# Patient Record
Sex: Male | Born: 1935 | Race: White | Hispanic: No
Health system: Southern US, Community
[De-identification: ages and names within clinical notes are randomized; demographics above are authoritative.]

---

## 2010-09-24 ENCOUNTER — Other Ambulatory Visit: Payer: Self-pay | Admitting: Urology

## 2010-09-24 ENCOUNTER — Ambulatory Visit (INDEPENDENT_AMBULATORY_CARE_PROVIDER_SITE_OTHER): Payer: Medicare Other | Admitting: Urology

## 2010-09-24 ENCOUNTER — Ambulatory Visit (HOSPITAL_COMMUNITY)
Admission: RE | Admit: 2010-09-24 | Discharge: 2010-09-24 | Disposition: A | Discharge status: Home / Self Care | Payer: Medicare Other | Source: Ambulatory Visit | Attending: Urology | Admitting: Urology

## 2010-09-24 DIAGNOSIS — R339 Retention of urine, unspecified: Secondary | ICD-10-CM

## 2010-09-24 DIAGNOSIS — N210 Calculus in bladder: Secondary | ICD-10-CM

## 2010-09-24 DIAGNOSIS — N420 Calculus of prostate: Secondary | ICD-10-CM

## 2010-09-24 DIAGNOSIS — R3915 Urgency of urination: Secondary | ICD-10-CM

## 2010-09-24 DIAGNOSIS — N401 Enlarged prostate with lower urinary tract symptoms: Secondary | ICD-10-CM

## 2010-09-24 DIAGNOSIS — R109 Unspecified abdominal pain: Secondary | ICD-10-CM | POA: Insufficient documentation

## 2010-11-05 ENCOUNTER — Ambulatory Visit (INDEPENDENT_AMBULATORY_CARE_PROVIDER_SITE_OTHER): Payer: Medicare Other | Admitting: Urology

## 2010-11-05 DIAGNOSIS — R351 Nocturia: Secondary | ICD-10-CM

## 2010-11-05 DIAGNOSIS — N401 Enlarged prostate with lower urinary tract symptoms: Secondary | ICD-10-CM

## 2010-11-05 DIAGNOSIS — N210 Calculus in bladder: Secondary | ICD-10-CM

## 2011-01-21 ENCOUNTER — Ambulatory Visit (INDEPENDENT_AMBULATORY_CARE_PROVIDER_SITE_OTHER): Payer: Medicare Other | Admitting: Urology

## 2011-01-21 DIAGNOSIS — N401 Enlarged prostate with lower urinary tract symptoms: Secondary | ICD-10-CM

## 2011-01-21 DIAGNOSIS — R3915 Urgency of urination: Secondary | ICD-10-CM

## 2011-06-10 ENCOUNTER — Ambulatory Visit (INDEPENDENT_AMBULATORY_CARE_PROVIDER_SITE_OTHER): Payer: Medicare Other | Admitting: Urology

## 2011-06-10 DIAGNOSIS — N210 Calculus in bladder: Secondary | ICD-10-CM

## 2011-06-10 DIAGNOSIS — R351 Nocturia: Secondary | ICD-10-CM

## 2011-06-10 DIAGNOSIS — R3915 Urgency of urination: Secondary | ICD-10-CM

## 2011-06-10 DIAGNOSIS — N401 Enlarged prostate with lower urinary tract symptoms: Secondary | ICD-10-CM

## 2011-07-19 DIAGNOSIS — N400 Enlarged prostate without lower urinary tract symptoms: Secondary | ICD-10-CM | POA: Diagnosis not present

## 2011-07-19 DIAGNOSIS — I10 Essential (primary) hypertension: Secondary | ICD-10-CM | POA: Diagnosis not present

## 2011-07-19 DIAGNOSIS — E782 Mixed hyperlipidemia: Secondary | ICD-10-CM | POA: Diagnosis not present

## 2011-07-29 DIAGNOSIS — E782 Mixed hyperlipidemia: Secondary | ICD-10-CM | POA: Diagnosis not present

## 2011-07-29 DIAGNOSIS — I10 Essential (primary) hypertension: Secondary | ICD-10-CM | POA: Diagnosis not present

## 2011-07-29 DIAGNOSIS — G4700 Insomnia, unspecified: Secondary | ICD-10-CM | POA: Diagnosis not present

## 2011-07-29 DIAGNOSIS — J438 Other emphysema: Secondary | ICD-10-CM | POA: Diagnosis not present

## 2011-07-29 DIAGNOSIS — N529 Male erectile dysfunction, unspecified: Secondary | ICD-10-CM | POA: Diagnosis not present

## 2011-07-29 DIAGNOSIS — M1990 Unspecified osteoarthritis, unspecified site: Secondary | ICD-10-CM | POA: Diagnosis not present

## 2011-07-29 DIAGNOSIS — I77811 Abdominal aortic ectasia: Secondary | ICD-10-CM | POA: Diagnosis not present

## 2011-07-29 DIAGNOSIS — N400 Enlarged prostate without lower urinary tract symptoms: Secondary | ICD-10-CM | POA: Diagnosis not present

## 2011-11-17 DIAGNOSIS — H353190 Nonexudative age-related macular degeneration, unspecified eye, stage unspecified: Secondary | ICD-10-CM | POA: Diagnosis not present

## 2012-01-13 ENCOUNTER — Ambulatory Visit: Payer: Medicare Other | Admitting: Urology

## 2012-02-01 DIAGNOSIS — I739 Peripheral vascular disease, unspecified: Secondary | ICD-10-CM | POA: Diagnosis not present

## 2012-02-01 DIAGNOSIS — M79609 Pain in unspecified limb: Secondary | ICD-10-CM | POA: Diagnosis not present

## 2012-02-01 DIAGNOSIS — B351 Tinea unguium: Secondary | ICD-10-CM | POA: Diagnosis not present

## 2012-02-03 DIAGNOSIS — E782 Mixed hyperlipidemia: Secondary | ICD-10-CM | POA: Diagnosis not present

## 2012-02-10 DIAGNOSIS — I4891 Unspecified atrial fibrillation: Secondary | ICD-10-CM | POA: Diagnosis not present

## 2012-02-10 DIAGNOSIS — N400 Enlarged prostate without lower urinary tract symptoms: Secondary | ICD-10-CM | POA: Diagnosis not present

## 2012-02-10 DIAGNOSIS — G4700 Insomnia, unspecified: Secondary | ICD-10-CM | POA: Diagnosis not present

## 2012-02-10 DIAGNOSIS — I77811 Abdominal aortic ectasia: Secondary | ICD-10-CM | POA: Diagnosis not present

## 2012-02-10 DIAGNOSIS — E782 Mixed hyperlipidemia: Secondary | ICD-10-CM | POA: Diagnosis not present

## 2012-02-10 DIAGNOSIS — M1990 Unspecified osteoarthritis, unspecified site: Secondary | ICD-10-CM | POA: Diagnosis not present

## 2012-02-10 DIAGNOSIS — J209 Acute bronchitis, unspecified: Secondary | ICD-10-CM | POA: Diagnosis not present

## 2012-02-10 DIAGNOSIS — I10 Essential (primary) hypertension: Secondary | ICD-10-CM | POA: Diagnosis not present

## 2012-02-14 DIAGNOSIS — N400 Enlarged prostate without lower urinary tract symptoms: Secondary | ICD-10-CM | POA: Diagnosis not present

## 2012-02-14 DIAGNOSIS — E782 Mixed hyperlipidemia: Secondary | ICD-10-CM | POA: Diagnosis not present

## 2012-02-14 DIAGNOSIS — I77811 Abdominal aortic ectasia: Secondary | ICD-10-CM | POA: Diagnosis not present

## 2012-02-14 DIAGNOSIS — N529 Male erectile dysfunction, unspecified: Secondary | ICD-10-CM | POA: Diagnosis not present

## 2012-02-14 DIAGNOSIS — I4891 Unspecified atrial fibrillation: Secondary | ICD-10-CM | POA: Diagnosis not present

## 2012-02-14 DIAGNOSIS — G4700 Insomnia, unspecified: Secondary | ICD-10-CM | POA: Diagnosis not present

## 2012-02-14 DIAGNOSIS — I10 Essential (primary) hypertension: Secondary | ICD-10-CM | POA: Diagnosis not present

## 2012-02-14 DIAGNOSIS — M1990 Unspecified osteoarthritis, unspecified site: Secondary | ICD-10-CM | POA: Diagnosis not present

## 2012-02-21 DIAGNOSIS — E782 Mixed hyperlipidemia: Secondary | ICD-10-CM | POA: Diagnosis not present

## 2012-02-21 DIAGNOSIS — M1990 Unspecified osteoarthritis, unspecified site: Secondary | ICD-10-CM | POA: Diagnosis not present

## 2012-02-21 DIAGNOSIS — G4700 Insomnia, unspecified: Secondary | ICD-10-CM | POA: Diagnosis not present

## 2012-02-21 DIAGNOSIS — I4891 Unspecified atrial fibrillation: Secondary | ICD-10-CM | POA: Diagnosis not present

## 2012-02-21 DIAGNOSIS — I10 Essential (primary) hypertension: Secondary | ICD-10-CM | POA: Diagnosis not present

## 2012-02-21 DIAGNOSIS — N400 Enlarged prostate without lower urinary tract symptoms: Secondary | ICD-10-CM | POA: Diagnosis not present

## 2012-02-21 DIAGNOSIS — N529 Male erectile dysfunction, unspecified: Secondary | ICD-10-CM | POA: Diagnosis not present

## 2012-02-21 DIAGNOSIS — J438 Other emphysema: Secondary | ICD-10-CM | POA: Diagnosis not present

## 2012-03-22 DIAGNOSIS — Z23 Encounter for immunization: Secondary | ICD-10-CM | POA: Diagnosis not present

## 2012-04-11 DIAGNOSIS — M79609 Pain in unspecified limb: Secondary | ICD-10-CM | POA: Diagnosis not present

## 2012-04-11 DIAGNOSIS — B351 Tinea unguium: Secondary | ICD-10-CM | POA: Diagnosis not present

## 2012-05-15 DIAGNOSIS — H353190 Nonexudative age-related macular degeneration, unspecified eye, stage unspecified: Secondary | ICD-10-CM | POA: Diagnosis not present

## 2012-05-18 ENCOUNTER — Ambulatory Visit (INDEPENDENT_AMBULATORY_CARE_PROVIDER_SITE_OTHER): Payer: Medicare Other | Admitting: Urology

## 2012-05-18 DIAGNOSIS — R351 Nocturia: Secondary | ICD-10-CM

## 2012-05-18 DIAGNOSIS — R3915 Urgency of urination: Secondary | ICD-10-CM

## 2012-05-18 DIAGNOSIS — N401 Enlarged prostate with lower urinary tract symptoms: Secondary | ICD-10-CM

## 2012-06-22 DIAGNOSIS — B351 Tinea unguium: Secondary | ICD-10-CM | POA: Diagnosis not present

## 2012-06-22 DIAGNOSIS — M79609 Pain in unspecified limb: Secondary | ICD-10-CM | POA: Diagnosis not present

## 2012-08-13 DIAGNOSIS — H353190 Nonexudative age-related macular degeneration, unspecified eye, stage unspecified: Secondary | ICD-10-CM | POA: Diagnosis not present

## 2012-08-24 ENCOUNTER — Ambulatory Visit: Payer: Medicare Other | Admitting: Urology

## 2012-10-19 ENCOUNTER — Ambulatory Visit (INDEPENDENT_AMBULATORY_CARE_PROVIDER_SITE_OTHER): Payer: Medicare Other | Admitting: Urology

## 2012-10-19 DIAGNOSIS — N401 Enlarged prostate with lower urinary tract symptoms: Secondary | ICD-10-CM

## 2012-10-19 DIAGNOSIS — I10 Essential (primary) hypertension: Secondary | ICD-10-CM | POA: Diagnosis not present

## 2012-10-19 DIAGNOSIS — E782 Mixed hyperlipidemia: Secondary | ICD-10-CM | POA: Diagnosis not present

## 2012-10-19 DIAGNOSIS — R5381 Other malaise: Secondary | ICD-10-CM | POA: Diagnosis not present

## 2012-10-19 DIAGNOSIS — N138 Other obstructive and reflux uropathy: Secondary | ICD-10-CM

## 2012-10-19 DIAGNOSIS — N210 Calculus in bladder: Secondary | ICD-10-CM

## 2012-10-22 DIAGNOSIS — N529 Male erectile dysfunction, unspecified: Secondary | ICD-10-CM | POA: Diagnosis not present

## 2012-10-22 DIAGNOSIS — N400 Enlarged prostate without lower urinary tract symptoms: Secondary | ICD-10-CM | POA: Diagnosis not present

## 2012-10-22 DIAGNOSIS — E782 Mixed hyperlipidemia: Secondary | ICD-10-CM | POA: Diagnosis not present

## 2012-10-22 DIAGNOSIS — J438 Other emphysema: Secondary | ICD-10-CM | POA: Diagnosis not present

## 2012-10-22 DIAGNOSIS — I4891 Unspecified atrial fibrillation: Secondary | ICD-10-CM | POA: Diagnosis not present

## 2012-10-22 DIAGNOSIS — I10 Essential (primary) hypertension: Secondary | ICD-10-CM | POA: Diagnosis not present

## 2012-10-22 DIAGNOSIS — J019 Acute sinusitis, unspecified: Secondary | ICD-10-CM | POA: Diagnosis not present

## 2012-10-22 DIAGNOSIS — M1990 Unspecified osteoarthritis, unspecified site: Secondary | ICD-10-CM | POA: Diagnosis not present

## 2013-01-04 DIAGNOSIS — I10 Essential (primary) hypertension: Secondary | ICD-10-CM | POA: Diagnosis not present

## 2013-01-04 DIAGNOSIS — E782 Mixed hyperlipidemia: Secondary | ICD-10-CM | POA: Diagnosis not present

## 2013-01-07 DIAGNOSIS — N529 Male erectile dysfunction, unspecified: Secondary | ICD-10-CM | POA: Diagnosis not present

## 2013-01-07 DIAGNOSIS — E782 Mixed hyperlipidemia: Secondary | ICD-10-CM | POA: Diagnosis not present

## 2013-01-07 DIAGNOSIS — I4891 Unspecified atrial fibrillation: Secondary | ICD-10-CM | POA: Diagnosis not present

## 2013-01-07 DIAGNOSIS — I77811 Abdominal aortic ectasia: Secondary | ICD-10-CM | POA: Diagnosis not present

## 2013-01-07 DIAGNOSIS — I10 Essential (primary) hypertension: Secondary | ICD-10-CM | POA: Diagnosis not present

## 2013-01-07 DIAGNOSIS — M1990 Unspecified osteoarthritis, unspecified site: Secondary | ICD-10-CM | POA: Diagnosis not present

## 2013-01-07 DIAGNOSIS — J438 Other emphysema: Secondary | ICD-10-CM | POA: Diagnosis not present

## 2013-01-07 DIAGNOSIS — N400 Enlarged prostate without lower urinary tract symptoms: Secondary | ICD-10-CM | POA: Diagnosis not present

## 2013-01-14 DIAGNOSIS — H353190 Nonexudative age-related macular degeneration, unspecified eye, stage unspecified: Secondary | ICD-10-CM | POA: Diagnosis not present

## 2013-02-15 ENCOUNTER — Ambulatory Visit (INDEPENDENT_AMBULATORY_CARE_PROVIDER_SITE_OTHER): Payer: Medicare Other | Admitting: Urology

## 2013-02-15 DIAGNOSIS — N401 Enlarged prostate with lower urinary tract symptoms: Secondary | ICD-10-CM

## 2013-02-15 DIAGNOSIS — N210 Calculus in bladder: Secondary | ICD-10-CM | POA: Diagnosis not present

## 2013-02-15 DIAGNOSIS — R82998 Other abnormal findings in urine: Secondary | ICD-10-CM | POA: Diagnosis not present

## 2013-03-18 DIAGNOSIS — B351 Tinea unguium: Secondary | ICD-10-CM | POA: Diagnosis not present

## 2013-03-18 DIAGNOSIS — L851 Acquired keratosis [keratoderma] palmaris et plantaris: Secondary | ICD-10-CM | POA: Diagnosis not present

## 2013-03-18 DIAGNOSIS — I70209 Unspecified atherosclerosis of native arteries of extremities, unspecified extremity: Secondary | ICD-10-CM | POA: Diagnosis not present

## 2013-03-26 DIAGNOSIS — Z23 Encounter for immunization: Secondary | ICD-10-CM | POA: Diagnosis not present

## 2013-04-30 DIAGNOSIS — E782 Mixed hyperlipidemia: Secondary | ICD-10-CM | POA: Diagnosis not present

## 2013-05-06 DIAGNOSIS — M1990 Unspecified osteoarthritis, unspecified site: Secondary | ICD-10-CM | POA: Diagnosis not present

## 2013-05-06 DIAGNOSIS — N529 Male erectile dysfunction, unspecified: Secondary | ICD-10-CM | POA: Diagnosis not present

## 2013-05-06 DIAGNOSIS — N400 Enlarged prostate without lower urinary tract symptoms: Secondary | ICD-10-CM | POA: Diagnosis not present

## 2013-05-06 DIAGNOSIS — J438 Other emphysema: Secondary | ICD-10-CM | POA: Diagnosis not present

## 2013-05-06 DIAGNOSIS — E782 Mixed hyperlipidemia: Secondary | ICD-10-CM | POA: Diagnosis not present

## 2013-05-06 DIAGNOSIS — I77811 Abdominal aortic ectasia: Secondary | ICD-10-CM | POA: Diagnosis not present

## 2013-05-06 DIAGNOSIS — I4891 Unspecified atrial fibrillation: Secondary | ICD-10-CM | POA: Diagnosis not present

## 2013-05-06 DIAGNOSIS — I10 Essential (primary) hypertension: Secondary | ICD-10-CM | POA: Diagnosis not present

## 2013-05-07 ENCOUNTER — Other Ambulatory Visit (HOSPITAL_COMMUNITY): Payer: Self-pay | Admitting: Family Medicine

## 2013-05-07 ENCOUNTER — Other Ambulatory Visit: Payer: Self-pay | Admitting: Urology

## 2013-05-07 DIAGNOSIS — I714 Abdominal aortic aneurysm, without rupture: Secondary | ICD-10-CM

## 2013-05-07 DIAGNOSIS — N210 Calculus in bladder: Secondary | ICD-10-CM

## 2013-05-10 ENCOUNTER — Other Ambulatory Visit: Payer: Self-pay | Admitting: Urology

## 2013-05-10 DIAGNOSIS — N401 Enlarged prostate with lower urinary tract symptoms: Secondary | ICD-10-CM

## 2013-05-16 DIAGNOSIS — H353190 Nonexudative age-related macular degeneration, unspecified eye, stage unspecified: Secondary | ICD-10-CM | POA: Diagnosis not present

## 2013-05-16 DIAGNOSIS — H4011X0 Primary open-angle glaucoma, stage unspecified: Secondary | ICD-10-CM | POA: Diagnosis not present

## 2013-06-17 DIAGNOSIS — B351 Tinea unguium: Secondary | ICD-10-CM | POA: Diagnosis not present

## 2013-06-17 DIAGNOSIS — I70209 Unspecified atherosclerosis of native arteries of extremities, unspecified extremity: Secondary | ICD-10-CM | POA: Diagnosis not present

## 2013-06-17 DIAGNOSIS — L851 Acquired keratosis [keratoderma] palmaris et plantaris: Secondary | ICD-10-CM | POA: Diagnosis not present

## 2013-07-22 ENCOUNTER — Other Ambulatory Visit: Payer: Self-pay | Admitting: Urology

## 2013-07-22 ENCOUNTER — Ambulatory Visit (HOSPITAL_COMMUNITY)
Admission: RE | Admit: 2013-07-22 | Discharge: 2013-07-22 | Disposition: A | Discharge status: Home / Self Care | Payer: Medicare Other | Source: Ambulatory Visit | Attending: Family Medicine | Admitting: Family Medicine

## 2013-07-22 ENCOUNTER — Ambulatory Visit (HOSPITAL_COMMUNITY)
Admission: RE | Admit: 2013-07-22 | Discharge: 2013-07-22 | Disposition: A | Discharge status: Home / Self Care | Payer: Medicare Other | Source: Ambulatory Visit | Attending: Urology | Admitting: Urology

## 2013-07-22 DIAGNOSIS — R9389 Abnormal findings on diagnostic imaging of other specified body structures: Secondary | ICD-10-CM | POA: Diagnosis not present

## 2013-07-22 DIAGNOSIS — I714 Abdominal aortic aneurysm, without rupture, unspecified: Secondary | ICD-10-CM

## 2013-07-22 DIAGNOSIS — N139 Obstructive and reflux uropathy, unspecified: Secondary | ICD-10-CM | POA: Insufficient documentation

## 2013-07-22 DIAGNOSIS — N400 Enlarged prostate without lower urinary tract symptoms: Secondary | ICD-10-CM | POA: Insufficient documentation

## 2013-07-22 DIAGNOSIS — N401 Enlarged prostate with lower urinary tract symptoms: Secondary | ICD-10-CM

## 2013-07-22 DIAGNOSIS — N138 Other obstructive and reflux uropathy: Secondary | ICD-10-CM

## 2013-07-22 DIAGNOSIS — Z135 Encounter for screening for eye and ear disorders: Secondary | ICD-10-CM | POA: Diagnosis not present

## 2013-08-26 DIAGNOSIS — L851 Acquired keratosis [keratoderma] palmaris et plantaris: Secondary | ICD-10-CM | POA: Diagnosis not present

## 2013-08-26 DIAGNOSIS — I70209 Unspecified atherosclerosis of native arteries of extremities, unspecified extremity: Secondary | ICD-10-CM | POA: Diagnosis not present

## 2013-08-26 DIAGNOSIS — B351 Tinea unguium: Secondary | ICD-10-CM | POA: Diagnosis not present

## 2013-10-04 ENCOUNTER — Ambulatory Visit (INDEPENDENT_AMBULATORY_CARE_PROVIDER_SITE_OTHER): Payer: Medicare Other | Admitting: Urology

## 2013-10-04 DIAGNOSIS — N401 Enlarged prostate with lower urinary tract symptoms: Secondary | ICD-10-CM | POA: Diagnosis not present

## 2013-10-04 DIAGNOSIS — N453 Epididymo-orchitis: Secondary | ICD-10-CM | POA: Diagnosis not present

## 2013-10-04 DIAGNOSIS — R82998 Other abnormal findings in urine: Secondary | ICD-10-CM | POA: Diagnosis not present

## 2013-10-04 DIAGNOSIS — N210 Calculus in bladder: Secondary | ICD-10-CM

## 2013-10-04 DIAGNOSIS — N138 Other obstructive and reflux uropathy: Secondary | ICD-10-CM | POA: Diagnosis not present

## 2013-11-08 DIAGNOSIS — I4891 Unspecified atrial fibrillation: Secondary | ICD-10-CM | POA: Diagnosis not present

## 2013-11-08 DIAGNOSIS — I10 Essential (primary) hypertension: Secondary | ICD-10-CM | POA: Diagnosis not present

## 2013-11-08 DIAGNOSIS — E782 Mixed hyperlipidemia: Secondary | ICD-10-CM | POA: Diagnosis not present

## 2013-11-08 DIAGNOSIS — E559 Vitamin D deficiency, unspecified: Secondary | ICD-10-CM | POA: Diagnosis not present

## 2013-11-11 DIAGNOSIS — Z0000 Encounter for general adult medical examination without abnormal findings: Secondary | ICD-10-CM | POA: Diagnosis not present

## 2013-11-11 DIAGNOSIS — I4891 Unspecified atrial fibrillation: Secondary | ICD-10-CM | POA: Diagnosis not present

## 2013-11-11 DIAGNOSIS — I10 Essential (primary) hypertension: Secondary | ICD-10-CM | POA: Diagnosis not present

## 2013-11-11 DIAGNOSIS — J438 Other emphysema: Secondary | ICD-10-CM | POA: Diagnosis not present

## 2013-11-11 DIAGNOSIS — E782 Mixed hyperlipidemia: Secondary | ICD-10-CM | POA: Diagnosis not present

## 2013-11-11 DIAGNOSIS — N400 Enlarged prostate without lower urinary tract symptoms: Secondary | ICD-10-CM | POA: Diagnosis not present

## 2013-11-11 DIAGNOSIS — N529 Male erectile dysfunction, unspecified: Secondary | ICD-10-CM | POA: Diagnosis not present

## 2013-11-11 DIAGNOSIS — M1990 Unspecified osteoarthritis, unspecified site: Secondary | ICD-10-CM | POA: Diagnosis not present

## 2013-12-13 ENCOUNTER — Ambulatory Visit (INDEPENDENT_AMBULATORY_CARE_PROVIDER_SITE_OTHER): Payer: Medicare Other | Admitting: Urology

## 2013-12-13 ENCOUNTER — Other Ambulatory Visit: Payer: Self-pay | Admitting: Urology

## 2013-12-13 DIAGNOSIS — N3020 Other chronic cystitis without hematuria: Secondary | ICD-10-CM | POA: Diagnosis not present

## 2013-12-13 DIAGNOSIS — N453 Epididymo-orchitis: Secondary | ICD-10-CM

## 2013-12-13 DIAGNOSIS — N401 Enlarged prostate with lower urinary tract symptoms: Secondary | ICD-10-CM | POA: Diagnosis not present

## 2013-12-13 DIAGNOSIS — R82998 Other abnormal findings in urine: Secondary | ICD-10-CM | POA: Diagnosis not present

## 2013-12-13 DIAGNOSIS — N210 Calculus in bladder: Secondary | ICD-10-CM | POA: Diagnosis not present

## 2014-02-28 ENCOUNTER — Ambulatory Visit (HOSPITAL_COMMUNITY): Admission: RE | Admit: 2014-02-28 | Payer: Medicare Other | Source: Ambulatory Visit

## 2014-05-08 DIAGNOSIS — Z23 Encounter for immunization: Secondary | ICD-10-CM | POA: Diagnosis not present

## 2014-05-30 DIAGNOSIS — I10 Essential (primary) hypertension: Secondary | ICD-10-CM | POA: Diagnosis not present

## 2014-05-30 DIAGNOSIS — E782 Mixed hyperlipidemia: Secondary | ICD-10-CM | POA: Diagnosis not present

## 2014-05-30 DIAGNOSIS — I482 Chronic atrial fibrillation: Secondary | ICD-10-CM | POA: Diagnosis not present

## 2014-06-02 DIAGNOSIS — N400 Enlarged prostate without lower urinary tract symptoms: Secondary | ICD-10-CM | POA: Diagnosis not present

## 2014-06-02 DIAGNOSIS — M25462 Effusion, left knee: Secondary | ICD-10-CM | POA: Diagnosis not present

## 2014-06-02 DIAGNOSIS — J439 Emphysema, unspecified: Secondary | ICD-10-CM | POA: Diagnosis not present

## 2014-06-02 DIAGNOSIS — I714 Abdominal aortic aneurysm, without rupture: Secondary | ICD-10-CM | POA: Diagnosis not present

## 2014-06-02 DIAGNOSIS — I482 Chronic atrial fibrillation: Secondary | ICD-10-CM | POA: Diagnosis not present

## 2014-06-02 DIAGNOSIS — E782 Mixed hyperlipidemia: Secondary | ICD-10-CM | POA: Diagnosis not present

## 2014-06-02 DIAGNOSIS — Z0001 Encounter for general adult medical examination with abnormal findings: Secondary | ICD-10-CM | POA: Diagnosis not present

## 2014-06-02 DIAGNOSIS — I10 Essential (primary) hypertension: Secondary | ICD-10-CM | POA: Diagnosis not present

## 2014-06-02 DIAGNOSIS — Z1389 Encounter for screening for other disorder: Secondary | ICD-10-CM | POA: Diagnosis not present

## 2014-09-09 DIAGNOSIS — R300 Dysuria: Secondary | ICD-10-CM | POA: Diagnosis not present

## 2014-11-29 IMAGING — US US PELVIS LIMITED
1 series · 9 of 9 positions shown · non-contrast
Comparison: None

Correlation:  CT pelvis 06/29/2010, abdominal radiograph 09/24/2010

CLINICAL DATA: Prostate hypertrophy, urinary tract obstruction

EXAM:
US PELVIS LIMITED
TECHNIQUE: Ultrasound examination of the pelvic soft tissues was performed in
the area of clinical concern including the bladder and visualized
prostate gland.

[Series 1: us pelvis limited · 0.21mm/px · 9 of 9 slices shown]
[im 1/9]
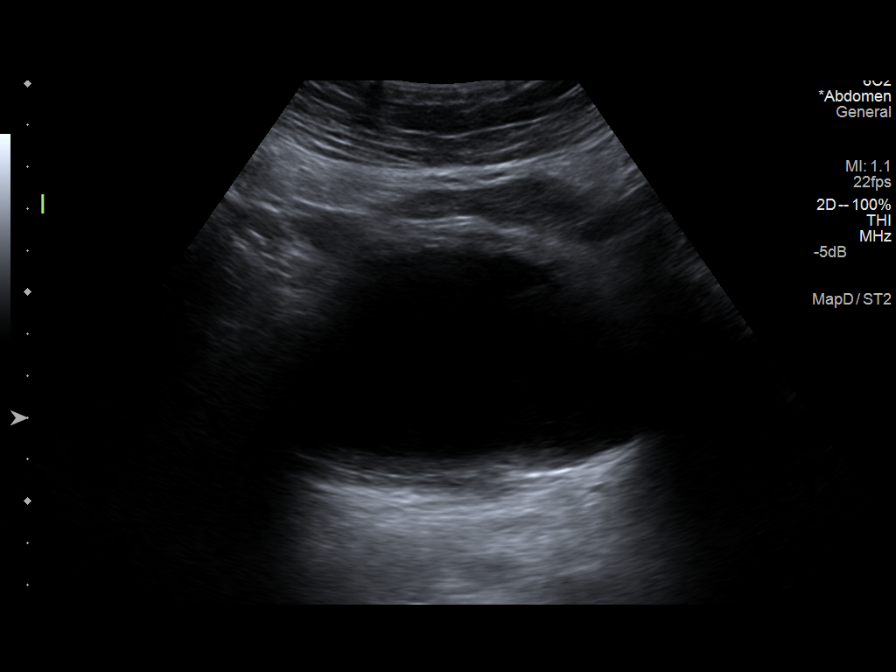
[im 2/9]
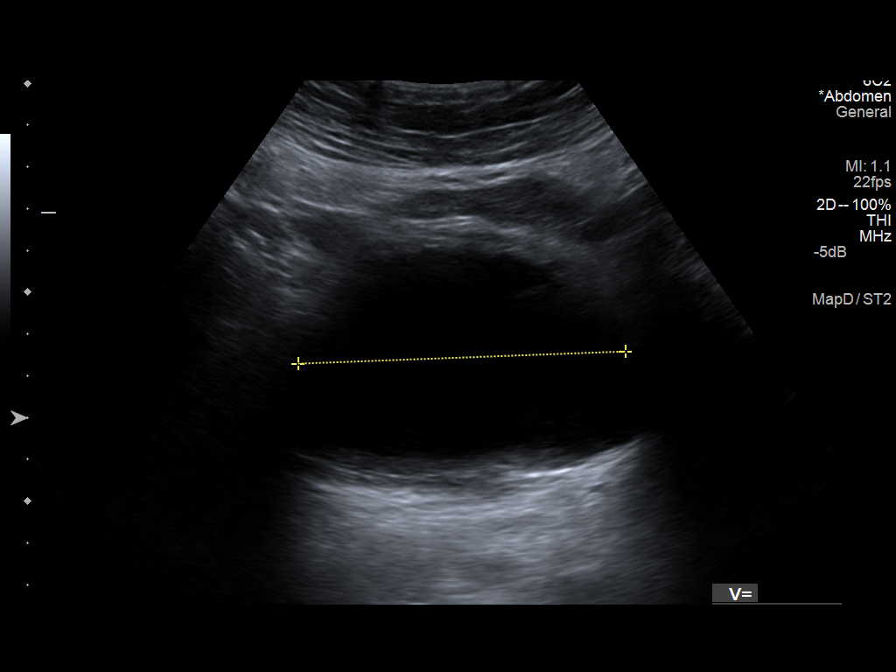
[im 3/9]
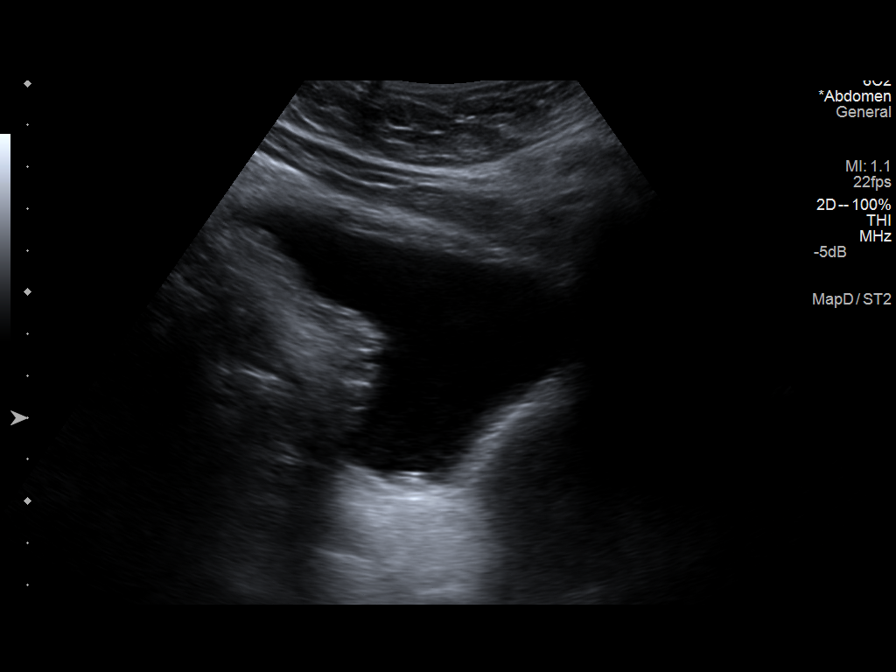
[im 4/9]
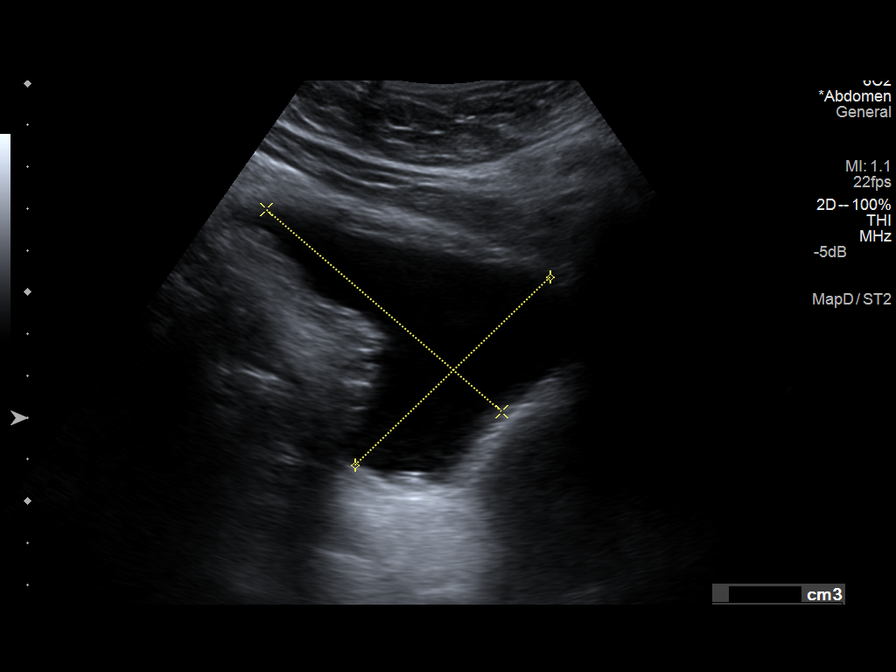
[im 5/9]
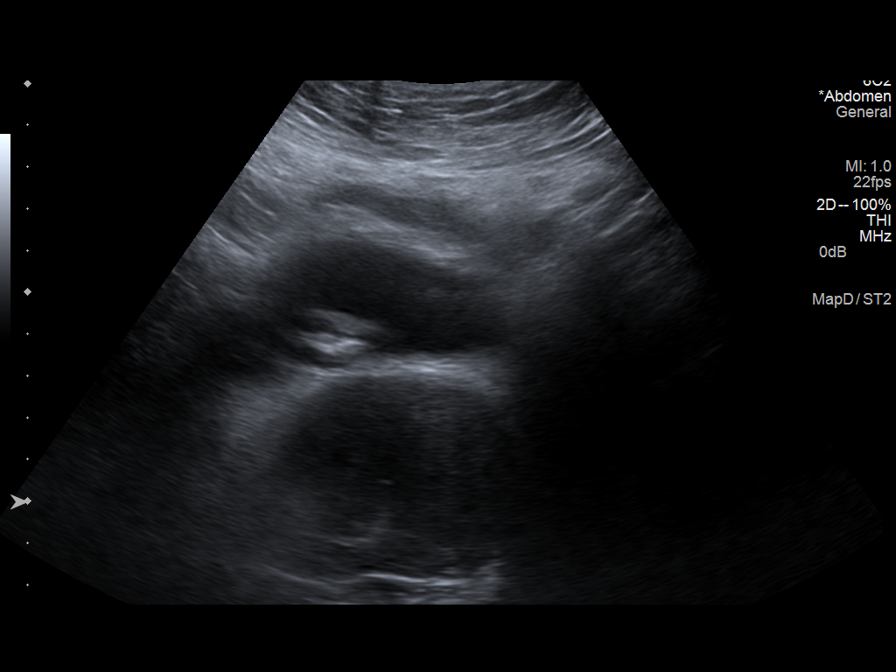
[im 6/9]
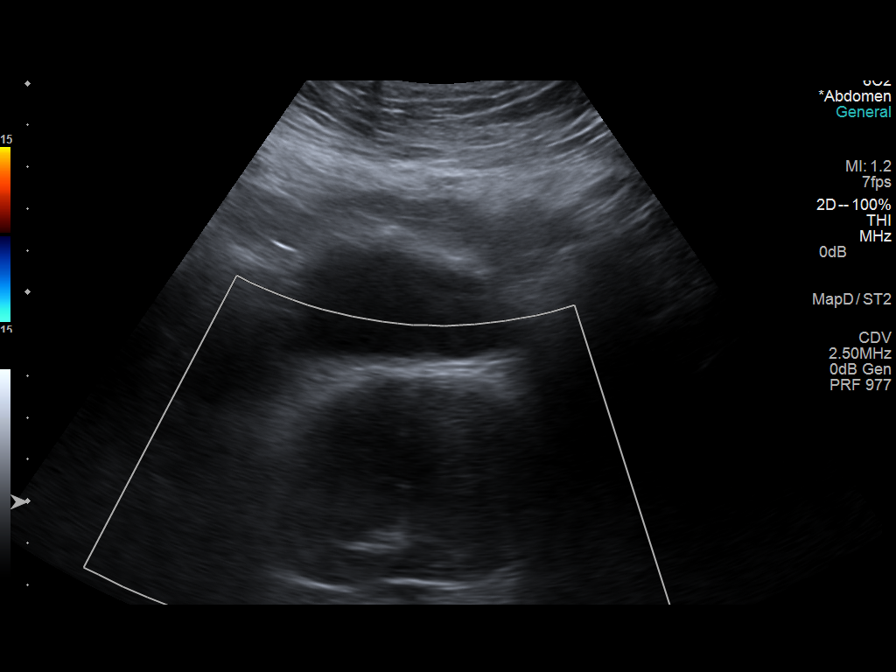
[im 7/9]
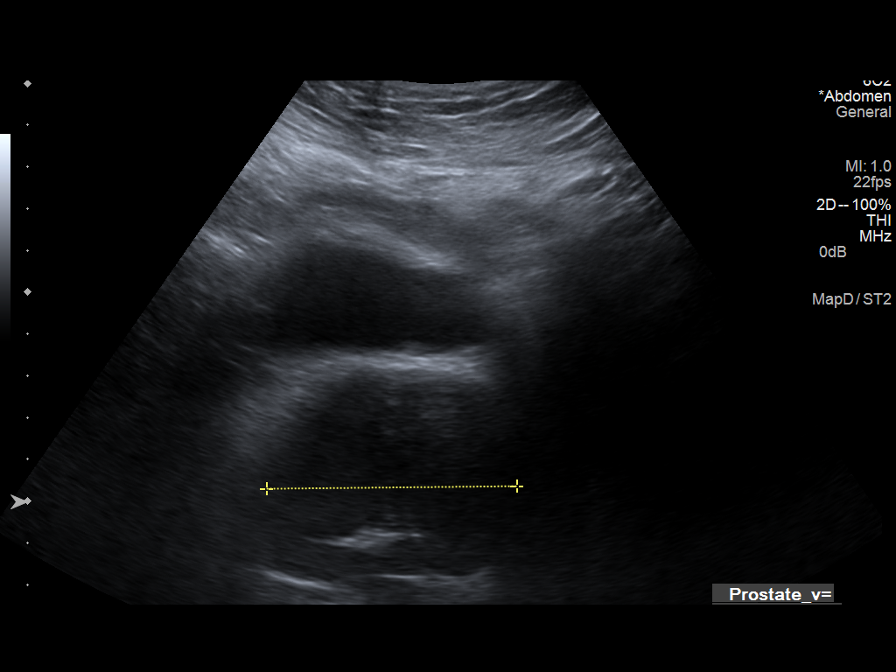
[im 8/9]
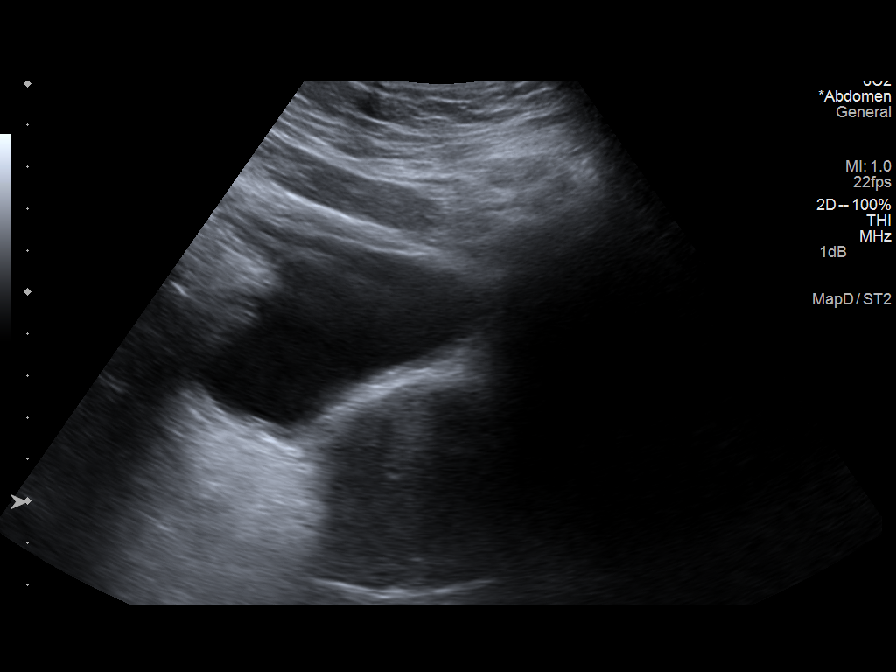
[im 9/9]
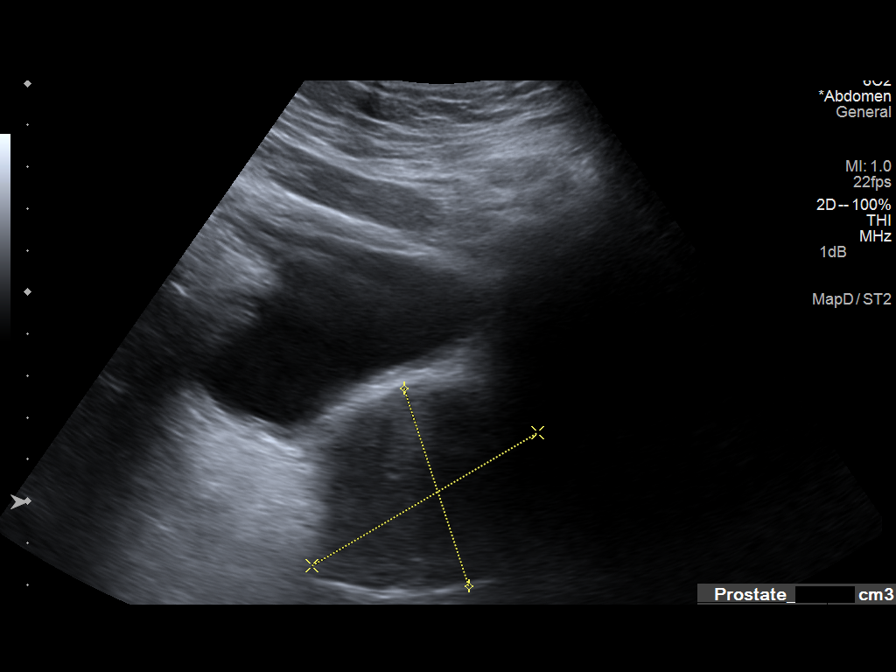

[9 of 9 positions shown; findings below may reference images not displayed]

FINDINGS: Bladder distends normally.

No definite bladder wall thickening or focal bladder mass
identified.

Bilateral ureteral jets noted.

Intraluminal echogenic focus with shadowing identified within
bladder 17 x 7 x 21 cm likely a large bladder calculus.

Patient had a smaller bladder calculus 14 mm in diameter on prior
abdominal radiograph.

Prevoid bladder volume calculated at 220 mL with a postvoid residual
of 198 mL, poor emptying demonstrated.

Prostate gland appears enlarged, 6.0 x 6.3 x 5.0 cm, calculated
volume 98 cm3.
IMPRESSION: Probable large bladder calculus 21 x 17 x 7 mm.

Significant postvoid urinary bladder residual volume.

Significant prostatic enlargement.

## 2014-11-29 IMAGING — US US PELVIS LIMITED
2 series · 14 of 19 positions shown · non-contrast
Comparison: None

Correlation:  CT pelvis 06/29/2010, abdominal radiograph 09/24/2010

CLINICAL DATA: Prostate hypertrophy, urinary tract obstruction

EXAM:
US PELVIS LIMITED
TECHNIQUE: Ultrasound examination of the pelvic soft tissues was performed in
the area of clinical concern including the bladder and visualized
prostate gland.

[Series 1: us pelvis limited · 0.18mm/px · 13 of 17 slices shown (1 of 2)]
[im 1/17]
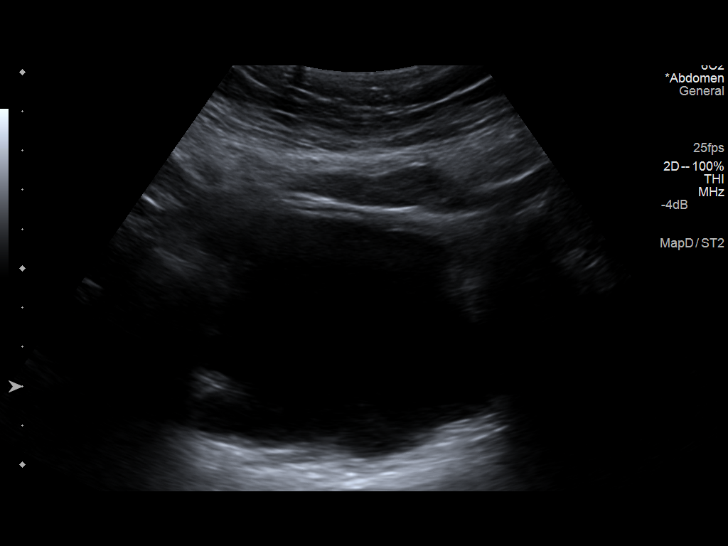
[im 3/17]
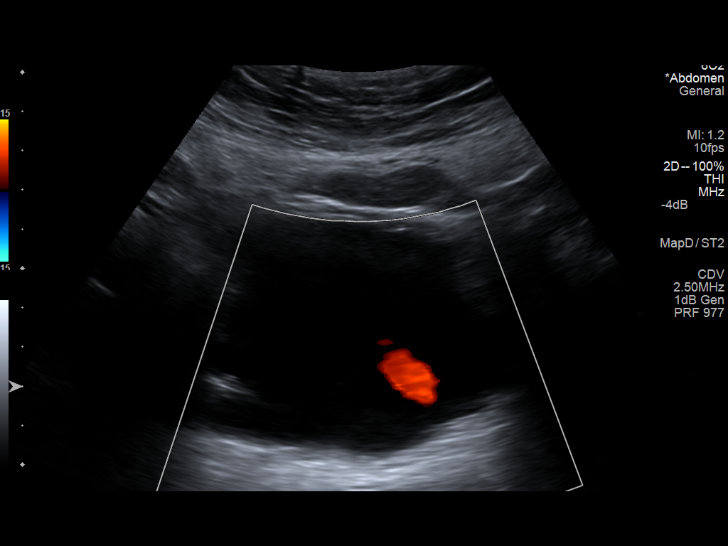
[im 4/17]
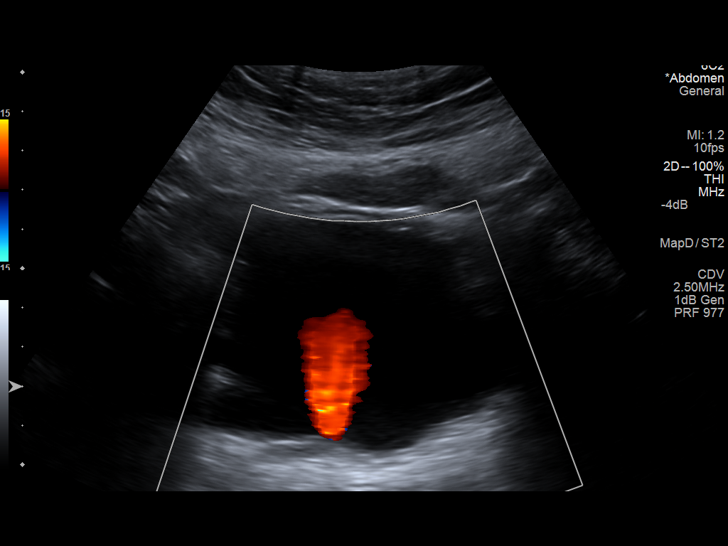
[im 5/17]
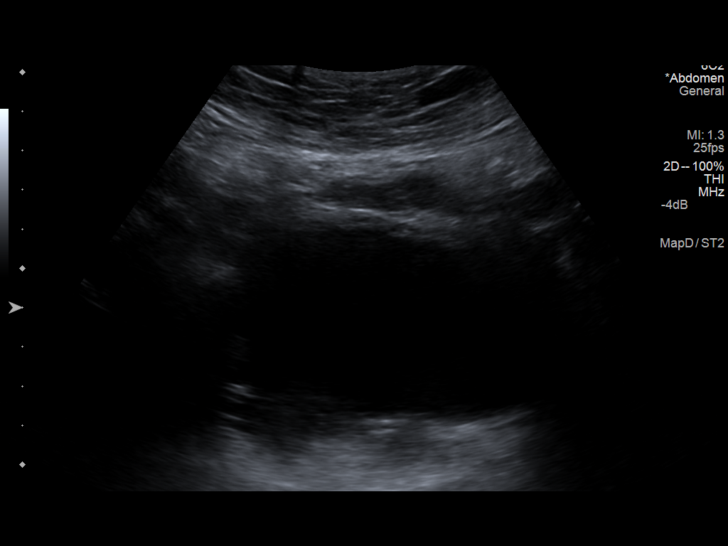
[im 7/17]
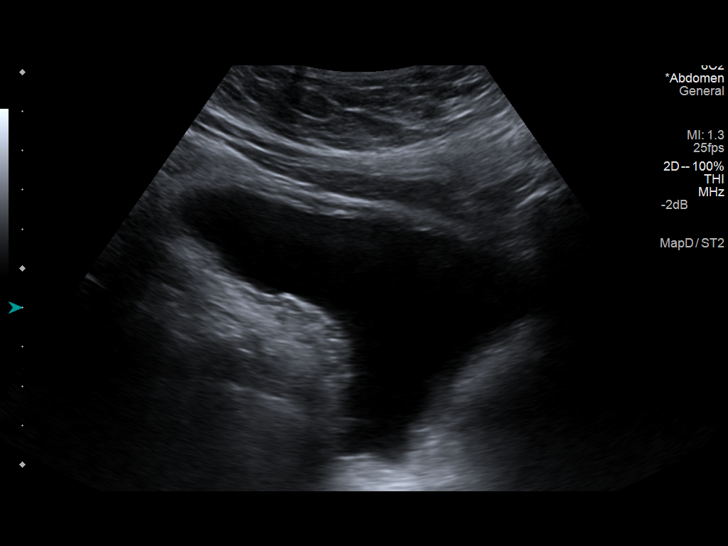
[im 8/17]
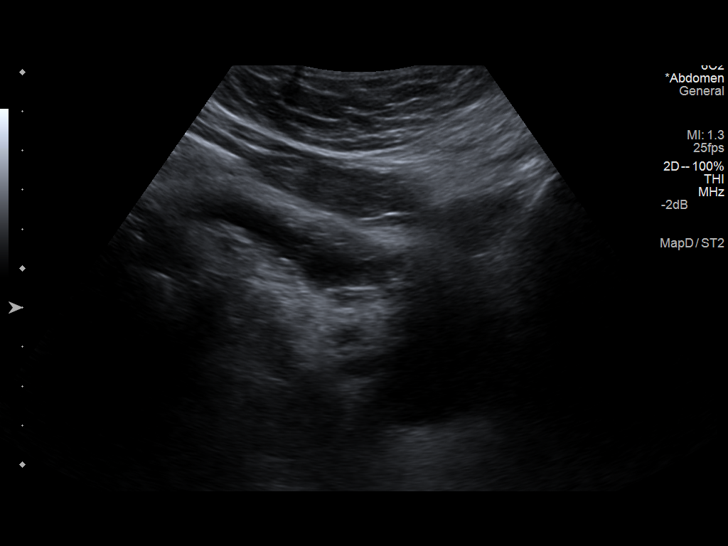
[im 9/17]
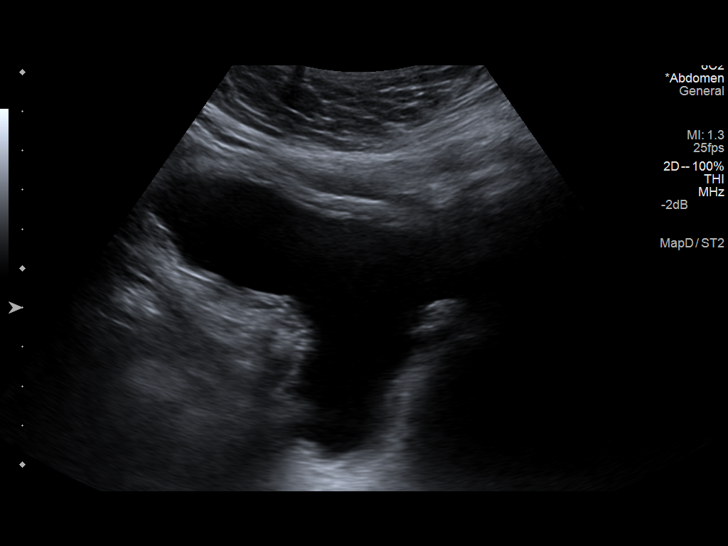
[im 11/17]
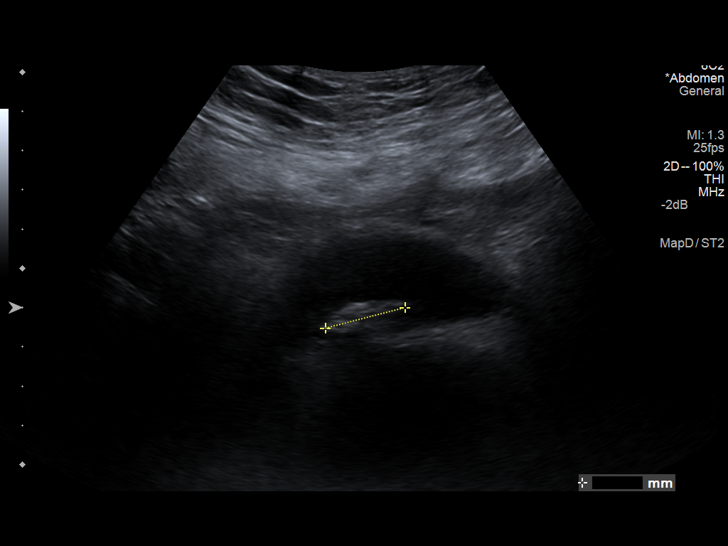
[im 12/17]
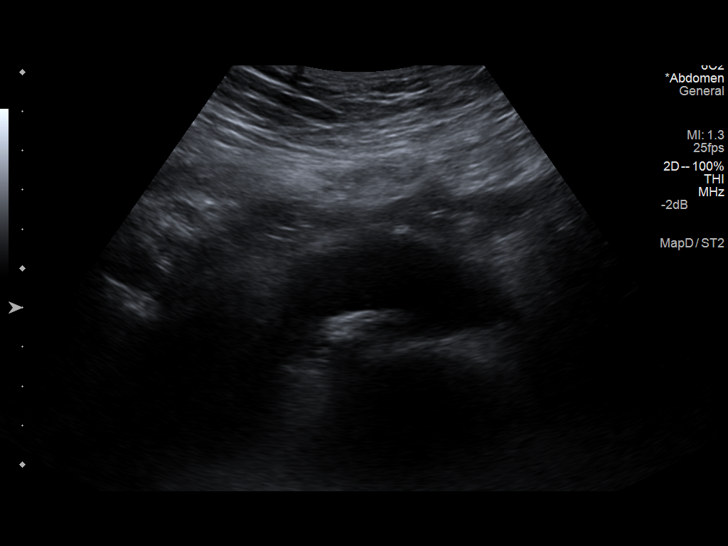
[im 13/17]
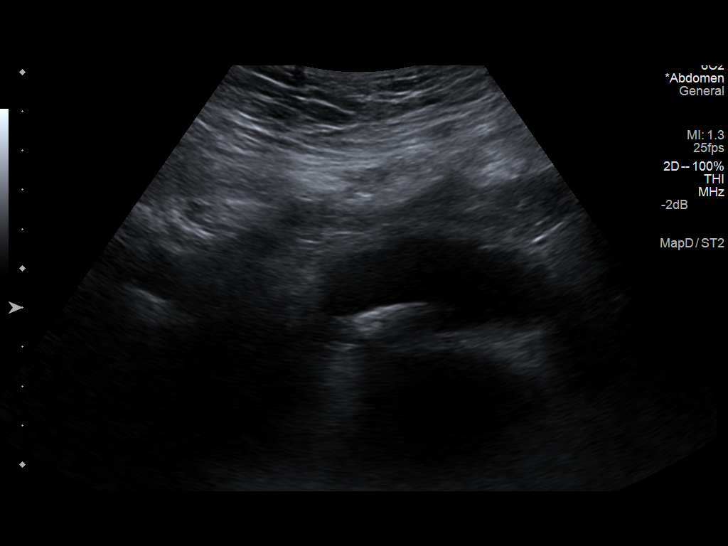
[im 15/17]
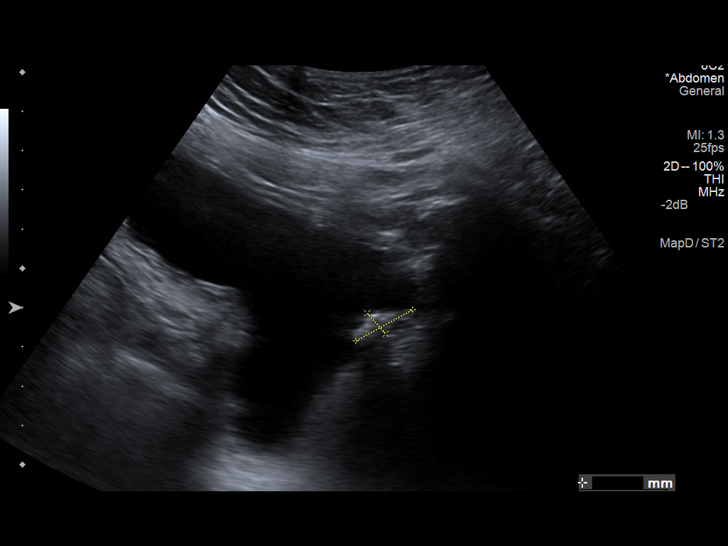
[im 16/17]
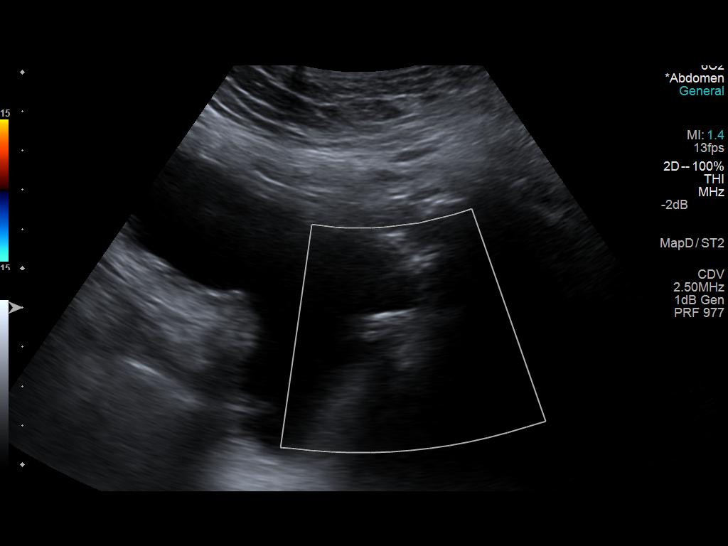
[im 17/17]
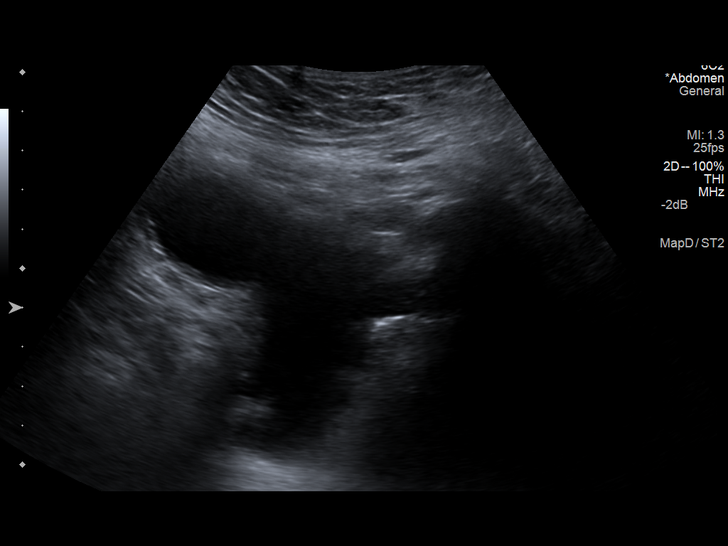

[Series 3: us pelvis limited · 0.18mm/px · 1 of 2 slices shown (2 of 2)]
[im 2/2]
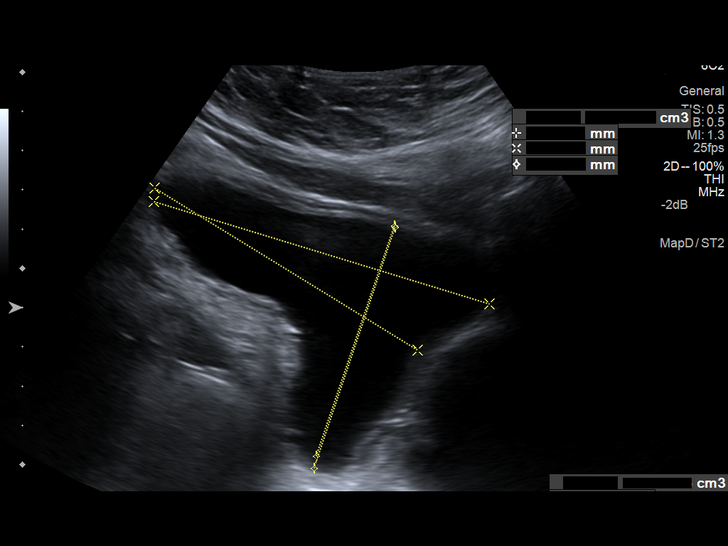

[14 of 19 positions shown; findings below may reference images not displayed]

FINDINGS: Bladder distends normally.

No definite bladder wall thickening or focal bladder mass
identified.

Bilateral ureteral jets noted.

Intraluminal echogenic focus with shadowing identified within
bladder 17 x 7 x 21 cm likely a large bladder calculus.

Patient had a smaller bladder calculus 14 mm in diameter on prior
abdominal radiograph.

Prevoid bladder volume calculated at 220 mL with a postvoid residual
of 198 mL, poor emptying demonstrated.

Prostate gland appears enlarged, 6.0 x 6.3 x 5.0 cm, calculated
volume 98 cm3.
IMPRESSION: Probable large bladder calculus 21 x 17 x 7 mm.

Significant postvoid urinary bladder residual volume.

Significant prostatic enlargement.

## 2014-11-29 IMAGING — US US AORTA
1 series · 14 of 25 positions shown · non-contrast
Comparison: None.

CLINICAL DATA: Abdominal aortic aneurysm

EXAM:
ULTRASOUND OF ABDOMINAL AORTA
TECHNIQUE: Ultrasound examination of the abdominal aorta was performed to
evaluate for abdominal aortic aneurysm.

[Series 1: us aorta · 0.37mm/px · 14 of 34 slices shown]
[im 1/34]
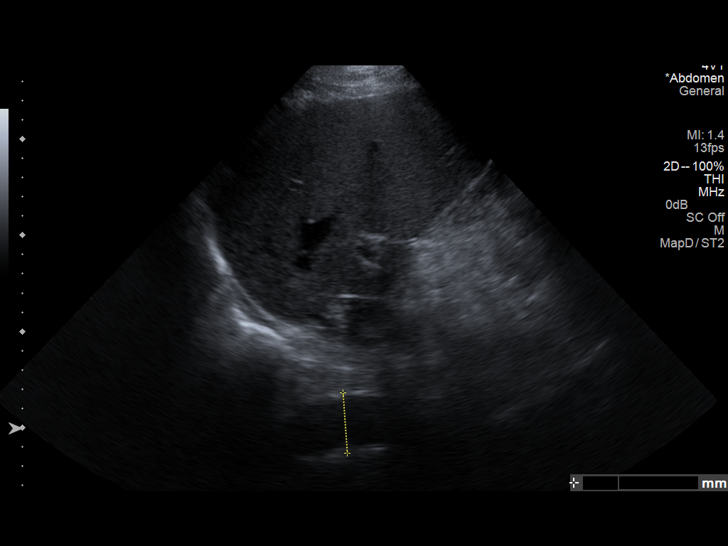
[im 3/34]
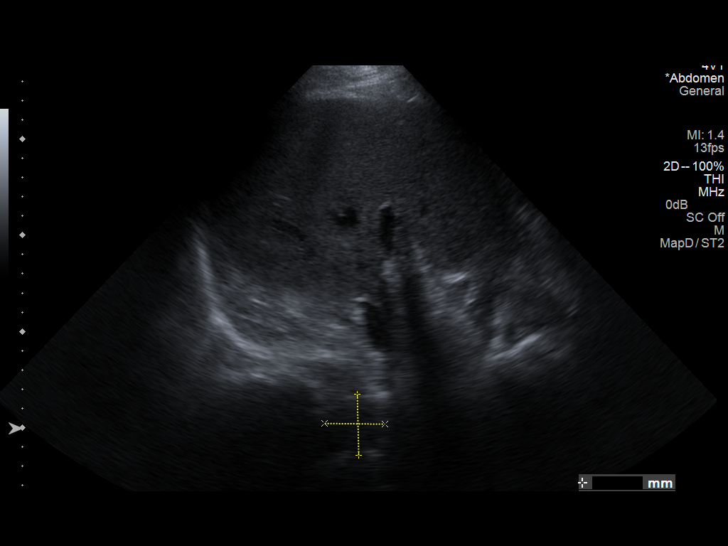
[im 6/34]
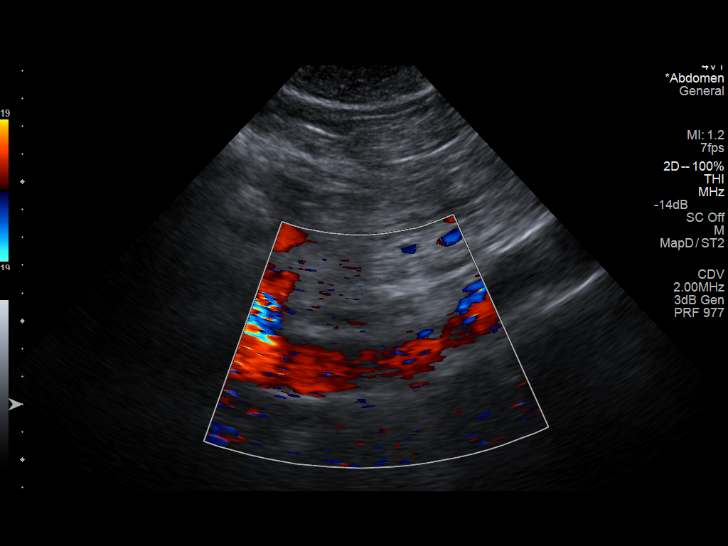
[im 9/34]
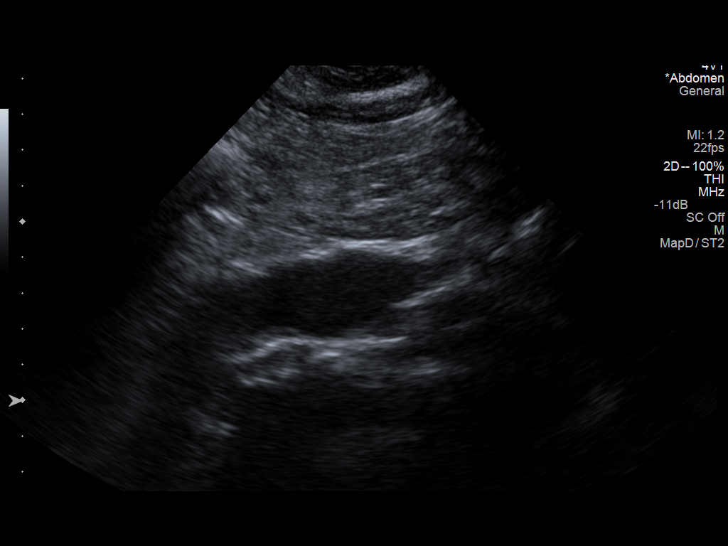
[im 12/34]
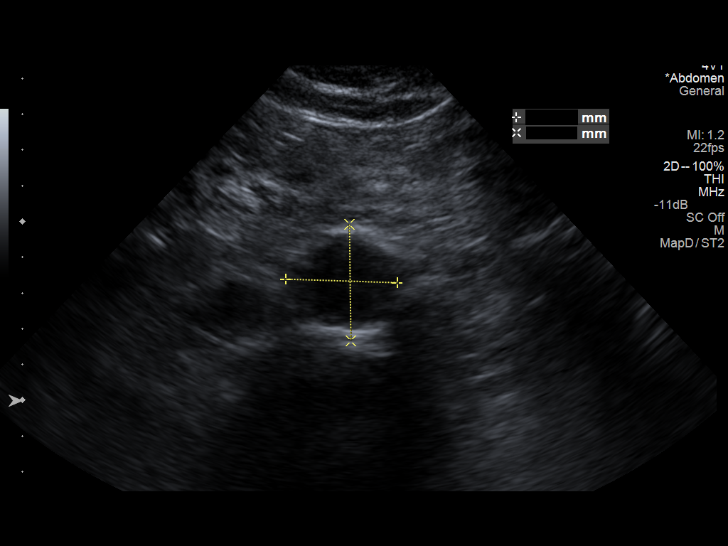
[im 13/34]
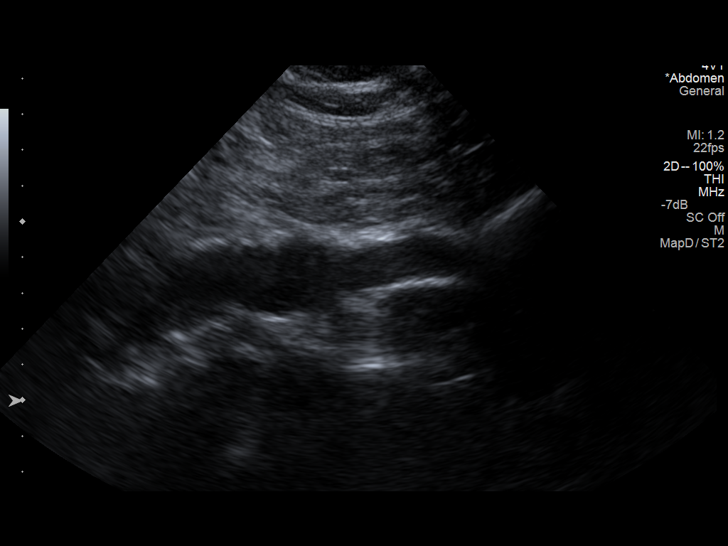
[im 16/34]
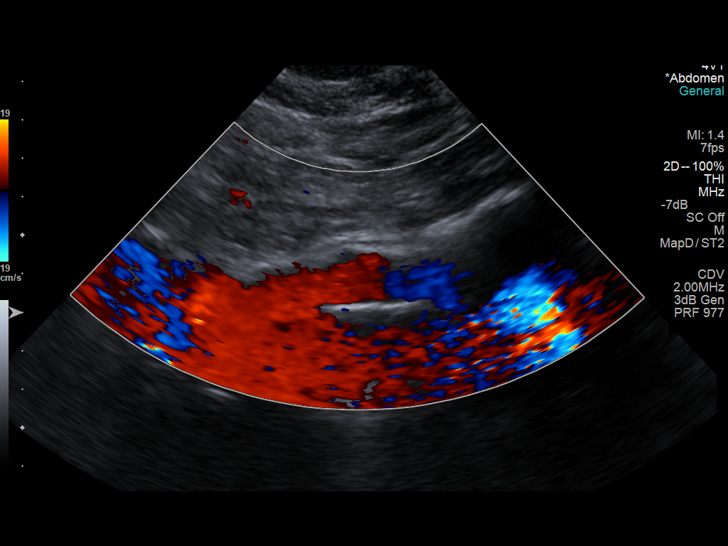
[im 18/34]
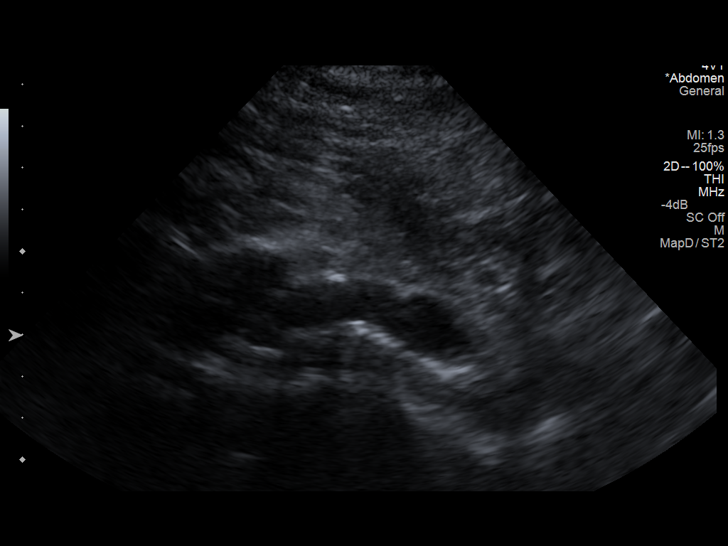
[im 21/34]
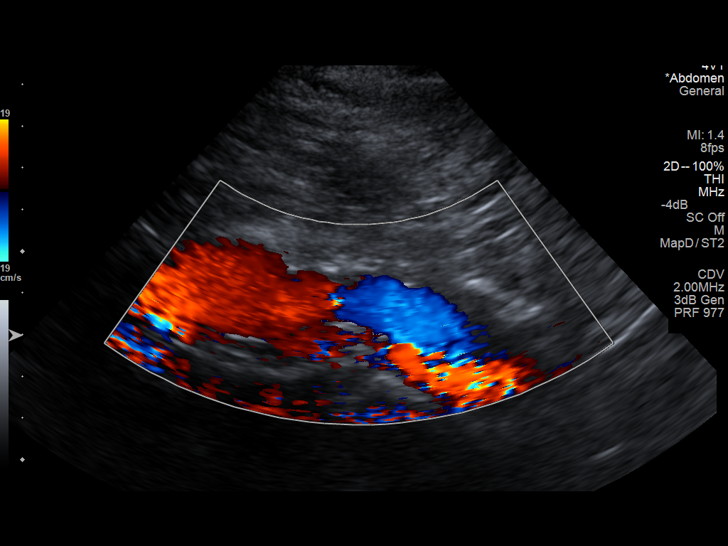
[im 23/34]
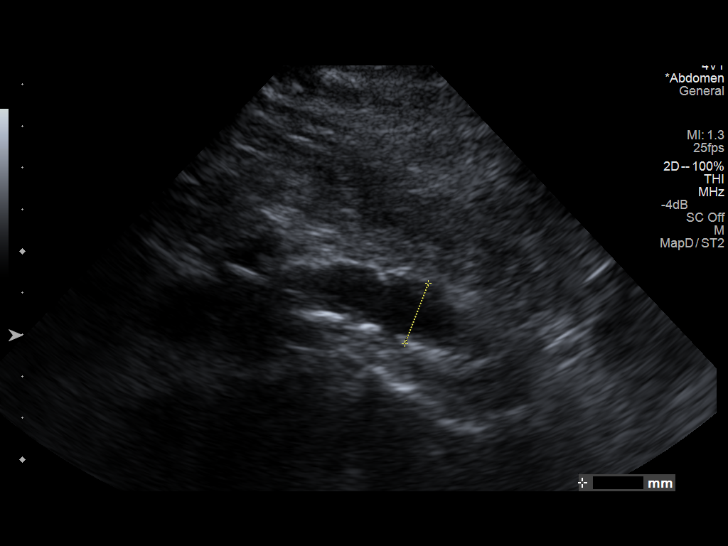
[im 25/34]
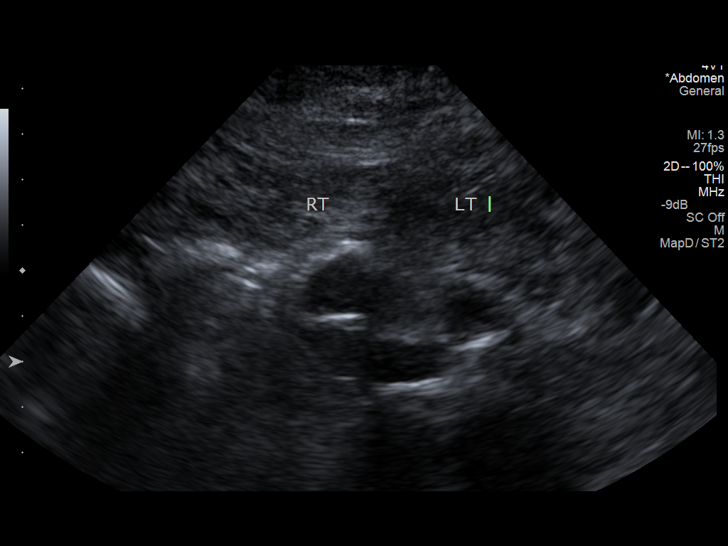
[im 28/34]
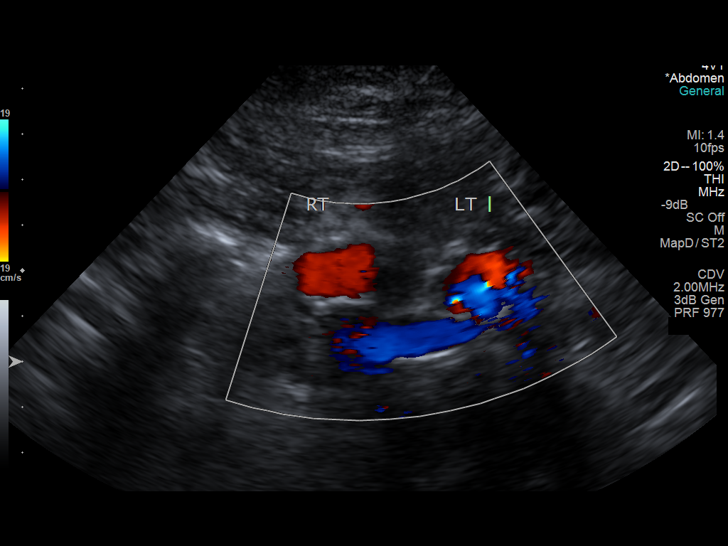
[im 31/34]
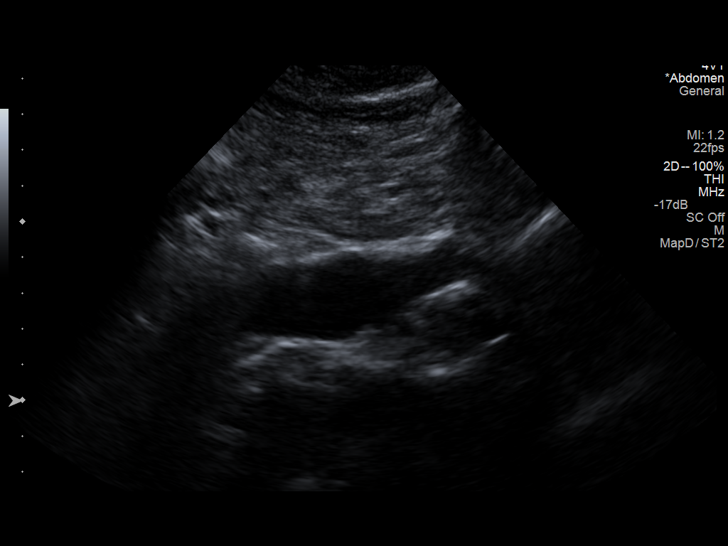
[im 34/34]
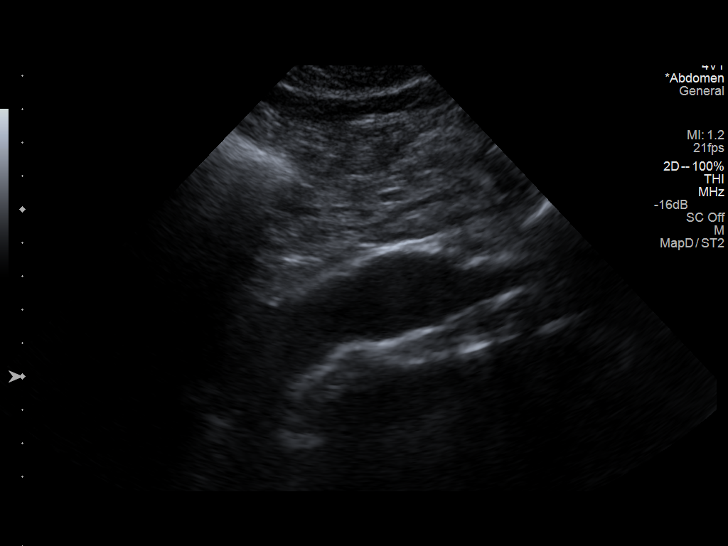

[14 of 25 positions shown; findings below may reference images not displayed]

FINDINGS: Abdominal Aorta

Ectasia of the aorta is noted. Previously, maximal diameter was
cm.

Maximum AP

Diameter:  3.3 cm.

Maximum TRV

Diameter: 3.1 cm.

Right and left common iliac artery diameters are 1.6 and 1.6 cm
respectively.
IMPRESSION: Maximal aortic diameter is 3.3 cm. Recommend follow up by US in
9years. This recommendation follows ACR consensus guidelines: White
Paper of the ACR Incidental Findings Committee II on Vascular
Findings. J Am Coll LadiolC03H; [DATE].

## 2014-12-19 DIAGNOSIS — E782 Mixed hyperlipidemia: Secondary | ICD-10-CM | POA: Diagnosis not present

## 2014-12-19 DIAGNOSIS — J439 Emphysema, unspecified: Secondary | ICD-10-CM | POA: Diagnosis not present

## 2014-12-19 DIAGNOSIS — I10 Essential (primary) hypertension: Secondary | ICD-10-CM | POA: Diagnosis not present

## 2014-12-19 DIAGNOSIS — I482 Chronic atrial fibrillation: Secondary | ICD-10-CM | POA: Diagnosis not present

## 2014-12-22 DIAGNOSIS — I10 Essential (primary) hypertension: Secondary | ICD-10-CM | POA: Diagnosis not present

## 2014-12-22 DIAGNOSIS — N400 Enlarged prostate without lower urinary tract symptoms: Secondary | ICD-10-CM | POA: Diagnosis not present

## 2014-12-22 DIAGNOSIS — L820 Inflamed seborrheic keratosis: Secondary | ICD-10-CM | POA: Diagnosis not present

## 2014-12-22 DIAGNOSIS — M1991 Primary osteoarthritis, unspecified site: Secondary | ICD-10-CM | POA: Diagnosis not present

## 2014-12-22 DIAGNOSIS — I714 Abdominal aortic aneurysm, without rupture: Secondary | ICD-10-CM | POA: Diagnosis not present

## 2014-12-22 DIAGNOSIS — E782 Mixed hyperlipidemia: Secondary | ICD-10-CM | POA: Diagnosis not present

## 2014-12-22 DIAGNOSIS — J439 Emphysema, unspecified: Secondary | ICD-10-CM | POA: Diagnosis not present

## 2014-12-22 DIAGNOSIS — I482 Chronic atrial fibrillation: Secondary | ICD-10-CM | POA: Diagnosis not present

## 2015-01-15 DIAGNOSIS — I482 Chronic atrial fibrillation: Secondary | ICD-10-CM | POA: Diagnosis not present

## 2015-01-15 DIAGNOSIS — J189 Pneumonia, unspecified organism: Secondary | ICD-10-CM | POA: Diagnosis not present

## 2015-01-15 DIAGNOSIS — E782 Mixed hyperlipidemia: Secondary | ICD-10-CM | POA: Diagnosis not present

## 2015-01-15 DIAGNOSIS — M1991 Primary osteoarthritis, unspecified site: Secondary | ICD-10-CM | POA: Diagnosis not present

## 2015-01-15 DIAGNOSIS — I10 Essential (primary) hypertension: Secondary | ICD-10-CM | POA: Diagnosis not present

## 2015-01-15 DIAGNOSIS — J439 Emphysema, unspecified: Secondary | ICD-10-CM | POA: Diagnosis not present

## 2015-01-15 DIAGNOSIS — I714 Abdominal aortic aneurysm, without rupture: Secondary | ICD-10-CM | POA: Diagnosis not present

## 2015-01-15 DIAGNOSIS — N400 Enlarged prostate without lower urinary tract symptoms: Secondary | ICD-10-CM | POA: Diagnosis not present

## 2015-01-16 DIAGNOSIS — E782 Mixed hyperlipidemia: Secondary | ICD-10-CM | POA: Diagnosis not present

## 2015-01-16 DIAGNOSIS — I714 Abdominal aortic aneurysm, without rupture: Secondary | ICD-10-CM | POA: Diagnosis not present

## 2015-01-16 DIAGNOSIS — M1991 Primary osteoarthritis, unspecified site: Secondary | ICD-10-CM | POA: Diagnosis not present

## 2015-01-16 DIAGNOSIS — J189 Pneumonia, unspecified organism: Secondary | ICD-10-CM | POA: Diagnosis not present

## 2015-01-16 DIAGNOSIS — N400 Enlarged prostate without lower urinary tract symptoms: Secondary | ICD-10-CM | POA: Diagnosis not present

## 2015-01-16 DIAGNOSIS — I10 Essential (primary) hypertension: Secondary | ICD-10-CM | POA: Diagnosis not present

## 2015-01-16 DIAGNOSIS — J439 Emphysema, unspecified: Secondary | ICD-10-CM | POA: Diagnosis not present

## 2015-01-16 DIAGNOSIS — I482 Chronic atrial fibrillation: Secondary | ICD-10-CM | POA: Diagnosis not present

## 2015-01-22 DIAGNOSIS — I482 Chronic atrial fibrillation: Secondary | ICD-10-CM | POA: Diagnosis not present

## 2015-01-22 DIAGNOSIS — M1991 Primary osteoarthritis, unspecified site: Secondary | ICD-10-CM | POA: Diagnosis not present

## 2015-01-22 DIAGNOSIS — I714 Abdominal aortic aneurysm, without rupture: Secondary | ICD-10-CM | POA: Diagnosis not present

## 2015-01-22 DIAGNOSIS — I10 Essential (primary) hypertension: Secondary | ICD-10-CM | POA: Diagnosis not present

## 2015-01-22 DIAGNOSIS — E782 Mixed hyperlipidemia: Secondary | ICD-10-CM | POA: Diagnosis not present

## 2015-01-22 DIAGNOSIS — J189 Pneumonia, unspecified organism: Secondary | ICD-10-CM | POA: Diagnosis not present

## 2015-01-22 DIAGNOSIS — N400 Enlarged prostate without lower urinary tract symptoms: Secondary | ICD-10-CM | POA: Diagnosis not present

## 2015-01-22 DIAGNOSIS — J439 Emphysema, unspecified: Secondary | ICD-10-CM | POA: Diagnosis not present

## 2015-01-23 ENCOUNTER — Ambulatory Visit (INDEPENDENT_AMBULATORY_CARE_PROVIDER_SITE_OTHER): Payer: Medicare Other | Admitting: Urology

## 2015-01-23 DIAGNOSIS — N401 Enlarged prostate with lower urinary tract symptoms: Secondary | ICD-10-CM | POA: Diagnosis not present

## 2015-01-23 DIAGNOSIS — R339 Retention of urine, unspecified: Secondary | ICD-10-CM | POA: Diagnosis not present

## 2015-01-23 DIAGNOSIS — N210 Calculus in bladder: Secondary | ICD-10-CM

## 2015-01-23 DIAGNOSIS — N3020 Other chronic cystitis without hematuria: Secondary | ICD-10-CM

## 2015-02-10 DIAGNOSIS — N400 Enlarged prostate without lower urinary tract symptoms: Secondary | ICD-10-CM | POA: Diagnosis not present

## 2015-02-10 DIAGNOSIS — M1991 Primary osteoarthritis, unspecified site: Secondary | ICD-10-CM | POA: Diagnosis not present

## 2015-02-10 DIAGNOSIS — J189 Pneumonia, unspecified organism: Secondary | ICD-10-CM | POA: Diagnosis not present

## 2015-02-10 DIAGNOSIS — I714 Abdominal aortic aneurysm, without rupture: Secondary | ICD-10-CM | POA: Diagnosis not present

## 2015-02-10 DIAGNOSIS — J439 Emphysema, unspecified: Secondary | ICD-10-CM | POA: Diagnosis not present

## 2015-02-10 DIAGNOSIS — E782 Mixed hyperlipidemia: Secondary | ICD-10-CM | POA: Diagnosis not present

## 2015-02-10 DIAGNOSIS — I482 Chronic atrial fibrillation: Secondary | ICD-10-CM | POA: Diagnosis not present

## 2015-02-10 DIAGNOSIS — I10 Essential (primary) hypertension: Secondary | ICD-10-CM | POA: Diagnosis not present

## 2015-04-14 DIAGNOSIS — Z23 Encounter for immunization: Secondary | ICD-10-CM | POA: Diagnosis not present

## 2015-06-19 DIAGNOSIS — E782 Mixed hyperlipidemia: Secondary | ICD-10-CM | POA: Diagnosis not present

## 2015-06-19 DIAGNOSIS — E559 Vitamin D deficiency, unspecified: Secondary | ICD-10-CM | POA: Diagnosis not present

## 2015-06-19 DIAGNOSIS — I10 Essential (primary) hypertension: Secondary | ICD-10-CM | POA: Diagnosis not present

## 2015-06-19 DIAGNOSIS — M1991 Primary osteoarthritis, unspecified site: Secondary | ICD-10-CM | POA: Diagnosis not present

## 2015-06-19 DIAGNOSIS — I714 Abdominal aortic aneurysm, without rupture: Secondary | ICD-10-CM | POA: Diagnosis not present

## 2015-06-19 DIAGNOSIS — I482 Chronic atrial fibrillation: Secondary | ICD-10-CM | POA: Diagnosis not present

## 2015-06-22 DIAGNOSIS — F5221 Male erectile disorder: Secondary | ICD-10-CM | POA: Diagnosis not present

## 2015-06-22 DIAGNOSIS — E782 Mixed hyperlipidemia: Secondary | ICD-10-CM | POA: Diagnosis not present

## 2015-06-22 DIAGNOSIS — N400 Enlarged prostate without lower urinary tract symptoms: Secondary | ICD-10-CM | POA: Diagnosis not present

## 2015-06-22 DIAGNOSIS — N210 Calculus in bladder: Secondary | ICD-10-CM | POA: Diagnosis not present

## 2015-06-22 DIAGNOSIS — J439 Emphysema, unspecified: Secondary | ICD-10-CM | POA: Diagnosis not present

## 2015-06-22 DIAGNOSIS — Z23 Encounter for immunization: Secondary | ICD-10-CM | POA: Diagnosis not present

## 2015-06-22 DIAGNOSIS — M1991 Primary osteoarthritis, unspecified site: Secondary | ICD-10-CM | POA: Diagnosis not present

## 2015-06-22 DIAGNOSIS — R911 Solitary pulmonary nodule: Secondary | ICD-10-CM | POA: Diagnosis not present

## 2015-06-22 DIAGNOSIS — I482 Chronic atrial fibrillation: Secondary | ICD-10-CM | POA: Diagnosis not present

## 2015-06-22 DIAGNOSIS — I10 Essential (primary) hypertension: Secondary | ICD-10-CM | POA: Diagnosis not present

## 2015-06-22 DIAGNOSIS — I714 Abdominal aortic aneurysm, without rupture: Secondary | ICD-10-CM | POA: Diagnosis not present

## 2015-06-22 DIAGNOSIS — Z0001 Encounter for general adult medical examination with abnormal findings: Secondary | ICD-10-CM | POA: Diagnosis not present

## 2016-01-07 DIAGNOSIS — R911 Solitary pulmonary nodule: Secondary | ICD-10-CM | POA: Diagnosis not present

## 2016-01-07 DIAGNOSIS — I482 Chronic atrial fibrillation: Secondary | ICD-10-CM | POA: Diagnosis not present

## 2016-01-07 DIAGNOSIS — E782 Mixed hyperlipidemia: Secondary | ICD-10-CM | POA: Diagnosis not present

## 2016-01-07 DIAGNOSIS — I10 Essential (primary) hypertension: Secondary | ICD-10-CM | POA: Diagnosis not present

## 2016-01-07 DIAGNOSIS — J189 Pneumonia, unspecified organism: Secondary | ICD-10-CM | POA: Diagnosis not present

## 2016-01-07 DIAGNOSIS — I714 Abdominal aortic aneurysm, without rupture: Secondary | ICD-10-CM | POA: Diagnosis not present

## 2016-01-07 DIAGNOSIS — J439 Emphysema, unspecified: Secondary | ICD-10-CM | POA: Diagnosis not present

## 2016-01-07 DIAGNOSIS — I700 Atherosclerosis of aorta: Secondary | ICD-10-CM | POA: Diagnosis not present

## 2016-01-15 DIAGNOSIS — M1991 Primary osteoarthritis, unspecified site: Secondary | ICD-10-CM | POA: Diagnosis not present

## 2016-01-15 DIAGNOSIS — I482 Chronic atrial fibrillation: Secondary | ICD-10-CM | POA: Diagnosis not present

## 2016-01-15 DIAGNOSIS — I10 Essential (primary) hypertension: Secondary | ICD-10-CM | POA: Diagnosis not present

## 2016-01-15 DIAGNOSIS — E782 Mixed hyperlipidemia: Secondary | ICD-10-CM | POA: Diagnosis not present

## 2016-01-15 DIAGNOSIS — J439 Emphysema, unspecified: Secondary | ICD-10-CM | POA: Diagnosis not present

## 2016-01-18 DIAGNOSIS — I482 Chronic atrial fibrillation: Secondary | ICD-10-CM | POA: Diagnosis not present

## 2016-01-18 DIAGNOSIS — I700 Atherosclerosis of aorta: Secondary | ICD-10-CM | POA: Diagnosis not present

## 2016-01-18 DIAGNOSIS — I10 Essential (primary) hypertension: Secondary | ICD-10-CM | POA: Diagnosis not present

## 2016-01-25 ENCOUNTER — Other Ambulatory Visit: Payer: Self-pay | Admitting: Urology

## 2016-01-25 DIAGNOSIS — N323 Diverticulum of bladder: Secondary | ICD-10-CM

## 2016-03-16 DIAGNOSIS — H3530 Unspecified macular degeneration: Secondary | ICD-10-CM | POA: Diagnosis not present

## 2016-03-16 DIAGNOSIS — H59811 Chorioretinal scars after surgery for detachment, right eye: Secondary | ICD-10-CM | POA: Diagnosis not present

## 2016-03-16 DIAGNOSIS — H401130 Primary open-angle glaucoma, bilateral, stage unspecified: Secondary | ICD-10-CM | POA: Diagnosis not present

## 2016-03-16 DIAGNOSIS — H3122 Choroidal dystrophy (central areolar) (generalized) (peripapillary): Secondary | ICD-10-CM | POA: Diagnosis not present

## 2016-06-06 DIAGNOSIS — H401130 Primary open-angle glaucoma, bilateral, stage unspecified: Secondary | ICD-10-CM | POA: Diagnosis not present

## 2017-01-20 DIAGNOSIS — E559 Vitamin D deficiency, unspecified: Secondary | ICD-10-CM | POA: Diagnosis not present

## 2017-01-20 DIAGNOSIS — I10 Essential (primary) hypertension: Secondary | ICD-10-CM | POA: Diagnosis not present

## 2017-01-20 DIAGNOSIS — M1991 Primary osteoarthritis, unspecified site: Secondary | ICD-10-CM | POA: Diagnosis not present

## 2017-01-20 DIAGNOSIS — I700 Atherosclerosis of aorta: Secondary | ICD-10-CM | POA: Diagnosis not present

## 2017-01-20 DIAGNOSIS — E782 Mixed hyperlipidemia: Secondary | ICD-10-CM | POA: Diagnosis not present

## 2017-01-20 DIAGNOSIS — I714 Abdominal aortic aneurysm, without rupture: Secondary | ICD-10-CM | POA: Diagnosis not present

## 2017-01-20 DIAGNOSIS — I482 Chronic atrial fibrillation: Secondary | ICD-10-CM | POA: Diagnosis not present

## 2017-01-23 DIAGNOSIS — I714 Abdominal aortic aneurysm, without rupture: Secondary | ICD-10-CM | POA: Diagnosis not present

## 2017-01-23 DIAGNOSIS — I10 Essential (primary) hypertension: Secondary | ICD-10-CM | POA: Diagnosis not present

## 2017-01-23 DIAGNOSIS — H6121 Impacted cerumen, right ear: Secondary | ICD-10-CM | POA: Diagnosis not present

## 2017-01-23 DIAGNOSIS — Z6828 Body mass index (BMI) 28.0-28.9, adult: Secondary | ICD-10-CM | POA: Diagnosis not present

## 2017-01-23 DIAGNOSIS — Z0001 Encounter for general adult medical examination with abnormal findings: Secondary | ICD-10-CM | POA: Diagnosis not present

## 2017-01-23 DIAGNOSIS — I700 Atherosclerosis of aorta: Secondary | ICD-10-CM | POA: Diagnosis not present

## 2017-01-23 DIAGNOSIS — E782 Mixed hyperlipidemia: Secondary | ICD-10-CM | POA: Diagnosis not present

## 2017-01-23 DIAGNOSIS — I482 Chronic atrial fibrillation: Secondary | ICD-10-CM | POA: Diagnosis not present

## 2017-02-07 DIAGNOSIS — I739 Peripheral vascular disease, unspecified: Secondary | ICD-10-CM | POA: Diagnosis not present

## 2017-02-07 DIAGNOSIS — L600 Ingrowing nail: Secondary | ICD-10-CM | POA: Diagnosis not present

## 2017-02-07 DIAGNOSIS — B351 Tinea unguium: Secondary | ICD-10-CM | POA: Diagnosis not present

## 2017-02-07 DIAGNOSIS — M25579 Pain in unspecified ankle and joints of unspecified foot: Secondary | ICD-10-CM | POA: Diagnosis not present

## 2017-05-01 DIAGNOSIS — B351 Tinea unguium: Secondary | ICD-10-CM | POA: Diagnosis not present

## 2017-05-01 DIAGNOSIS — L600 Ingrowing nail: Secondary | ICD-10-CM | POA: Diagnosis not present

## 2017-05-01 DIAGNOSIS — I739 Peripheral vascular disease, unspecified: Secondary | ICD-10-CM | POA: Diagnosis not present

## 2017-05-01 DIAGNOSIS — M25579 Pain in unspecified ankle and joints of unspecified foot: Secondary | ICD-10-CM | POA: Diagnosis not present

## 2017-07-07 DIAGNOSIS — N182 Chronic kidney disease, stage 2 (mild): Secondary | ICD-10-CM | POA: Diagnosis not present

## 2017-07-07 DIAGNOSIS — I10 Essential (primary) hypertension: Secondary | ICD-10-CM | POA: Diagnosis not present

## 2017-07-07 DIAGNOSIS — I482 Chronic atrial fibrillation: Secondary | ICD-10-CM | POA: Diagnosis not present

## 2017-07-07 DIAGNOSIS — E559 Vitamin D deficiency, unspecified: Secondary | ICD-10-CM | POA: Diagnosis not present

## 2017-07-07 DIAGNOSIS — E876 Hypokalemia: Secondary | ICD-10-CM | POA: Diagnosis not present

## 2017-07-07 DIAGNOSIS — E782 Mixed hyperlipidemia: Secondary | ICD-10-CM | POA: Diagnosis not present

## 2017-07-07 DIAGNOSIS — F5221 Male erectile disorder: Secondary | ICD-10-CM | POA: Diagnosis not present

## 2017-07-10 DIAGNOSIS — J209 Acute bronchitis, unspecified: Secondary | ICD-10-CM | POA: Diagnosis not present

## 2017-07-10 DIAGNOSIS — I714 Abdominal aortic aneurysm, without rupture: Secondary | ICD-10-CM | POA: Diagnosis not present

## 2017-07-10 DIAGNOSIS — I10 Essential (primary) hypertension: Secondary | ICD-10-CM | POA: Diagnosis not present

## 2017-07-10 DIAGNOSIS — I482 Chronic atrial fibrillation: Secondary | ICD-10-CM | POA: Diagnosis not present

## 2017-07-10 DIAGNOSIS — J439 Emphysema, unspecified: Secondary | ICD-10-CM | POA: Diagnosis not present

## 2017-07-10 DIAGNOSIS — N182 Chronic kidney disease, stage 2 (mild): Secondary | ICD-10-CM | POA: Diagnosis not present

## 2017-07-10 DIAGNOSIS — I700 Atherosclerosis of aorta: Secondary | ICD-10-CM | POA: Diagnosis not present

## 2017-07-10 DIAGNOSIS — E782 Mixed hyperlipidemia: Secondary | ICD-10-CM | POA: Diagnosis not present

## 2017-07-24 DIAGNOSIS — B351 Tinea unguium: Secondary | ICD-10-CM | POA: Diagnosis not present

## 2017-07-24 DIAGNOSIS — I739 Peripheral vascular disease, unspecified: Secondary | ICD-10-CM | POA: Diagnosis not present

## 2017-07-24 DIAGNOSIS — M25579 Pain in unspecified ankle and joints of unspecified foot: Secondary | ICD-10-CM | POA: Diagnosis not present

## 2017-07-24 DIAGNOSIS — L600 Ingrowing nail: Secondary | ICD-10-CM | POA: Diagnosis not present

## 2017-10-30 DIAGNOSIS — B351 Tinea unguium: Secondary | ICD-10-CM | POA: Diagnosis not present

## 2017-10-30 DIAGNOSIS — M25579 Pain in unspecified ankle and joints of unspecified foot: Secondary | ICD-10-CM | POA: Diagnosis not present

## 2017-10-30 DIAGNOSIS — L600 Ingrowing nail: Secondary | ICD-10-CM | POA: Diagnosis not present

## 2017-10-30 DIAGNOSIS — I739 Peripheral vascular disease, unspecified: Secondary | ICD-10-CM | POA: Diagnosis not present

## 2018-01-22 DIAGNOSIS — B351 Tinea unguium: Secondary | ICD-10-CM | POA: Diagnosis not present

## 2018-01-22 DIAGNOSIS — L600 Ingrowing nail: Secondary | ICD-10-CM | POA: Diagnosis not present

## 2018-01-22 DIAGNOSIS — I739 Peripheral vascular disease, unspecified: Secondary | ICD-10-CM | POA: Diagnosis not present

## 2018-01-22 DIAGNOSIS — M25579 Pain in unspecified ankle and joints of unspecified foot: Secondary | ICD-10-CM | POA: Diagnosis not present

## 2018-01-26 DIAGNOSIS — E876 Hypokalemia: Secondary | ICD-10-CM | POA: Diagnosis not present

## 2018-01-26 DIAGNOSIS — E559 Vitamin D deficiency, unspecified: Secondary | ICD-10-CM | POA: Diagnosis not present

## 2018-01-26 DIAGNOSIS — D519 Vitamin B12 deficiency anemia, unspecified: Secondary | ICD-10-CM | POA: Diagnosis not present

## 2018-01-26 DIAGNOSIS — I10 Essential (primary) hypertension: Secondary | ICD-10-CM | POA: Diagnosis not present

## 2018-01-26 DIAGNOSIS — I714 Abdominal aortic aneurysm, without rupture: Secondary | ICD-10-CM | POA: Diagnosis not present

## 2018-01-26 DIAGNOSIS — Z87891 Personal history of nicotine dependence: Secondary | ICD-10-CM | POA: Diagnosis not present

## 2018-01-29 DIAGNOSIS — I10 Essential (primary) hypertension: Secondary | ICD-10-CM | POA: Diagnosis not present

## 2018-01-29 DIAGNOSIS — E876 Hypokalemia: Secondary | ICD-10-CM | POA: Diagnosis not present

## 2018-01-29 DIAGNOSIS — I482 Chronic atrial fibrillation: Secondary | ICD-10-CM | POA: Diagnosis not present

## 2018-01-29 DIAGNOSIS — I700 Atherosclerosis of aorta: Secondary | ICD-10-CM | POA: Diagnosis not present

## 2018-01-29 DIAGNOSIS — Z0001 Encounter for general adult medical examination with abnormal findings: Secondary | ICD-10-CM | POA: Diagnosis not present

## 2018-01-29 DIAGNOSIS — I714 Abdominal aortic aneurysm, without rupture: Secondary | ICD-10-CM | POA: Diagnosis not present

## 2018-01-29 DIAGNOSIS — E782 Mixed hyperlipidemia: Secondary | ICD-10-CM | POA: Diagnosis not present

## 2018-01-29 DIAGNOSIS — J439 Emphysema, unspecified: Secondary | ICD-10-CM | POA: Diagnosis not present

## 2018-04-23 DIAGNOSIS — B351 Tinea unguium: Secondary | ICD-10-CM | POA: Diagnosis not present

## 2018-04-23 DIAGNOSIS — L600 Ingrowing nail: Secondary | ICD-10-CM | POA: Diagnosis not present

## 2018-04-23 DIAGNOSIS — L03032 Cellulitis of left toe: Secondary | ICD-10-CM | POA: Diagnosis not present

## 2018-04-23 DIAGNOSIS — M25579 Pain in unspecified ankle and joints of unspecified foot: Secondary | ICD-10-CM | POA: Diagnosis not present

## 2018-07-16 DIAGNOSIS — M25579 Pain in unspecified ankle and joints of unspecified foot: Secondary | ICD-10-CM | POA: Diagnosis not present

## 2018-07-16 DIAGNOSIS — M79671 Pain in right foot: Secondary | ICD-10-CM | POA: Diagnosis not present

## 2018-07-16 DIAGNOSIS — M79675 Pain in left toe(s): Secondary | ICD-10-CM | POA: Diagnosis not present

## 2018-07-16 DIAGNOSIS — I739 Peripheral vascular disease, unspecified: Secondary | ICD-10-CM | POA: Diagnosis not present

## 2018-07-16 DIAGNOSIS — M79674 Pain in right toe(s): Secondary | ICD-10-CM | POA: Diagnosis not present

## 2018-07-16 DIAGNOSIS — M79672 Pain in left foot: Secondary | ICD-10-CM | POA: Diagnosis not present

## 2018-07-16 DIAGNOSIS — L600 Ingrowing nail: Secondary | ICD-10-CM | POA: Diagnosis not present

## 2018-07-16 DIAGNOSIS — B351 Tinea unguium: Secondary | ICD-10-CM | POA: Diagnosis not present

## 2018-08-03 DIAGNOSIS — E876 Hypokalemia: Secondary | ICD-10-CM | POA: Diagnosis not present

## 2018-08-03 DIAGNOSIS — I10 Essential (primary) hypertension: Secondary | ICD-10-CM | POA: Diagnosis not present

## 2018-08-03 DIAGNOSIS — D519 Vitamin B12 deficiency anemia, unspecified: Secondary | ICD-10-CM | POA: Diagnosis not present

## 2018-08-06 DIAGNOSIS — I482 Chronic atrial fibrillation, unspecified: Secondary | ICD-10-CM | POA: Diagnosis not present

## 2018-08-06 DIAGNOSIS — N182 Chronic kidney disease, stage 2 (mild): Secondary | ICD-10-CM | POA: Diagnosis not present

## 2018-08-06 DIAGNOSIS — J439 Emphysema, unspecified: Secondary | ICD-10-CM | POA: Diagnosis not present

## 2018-08-06 DIAGNOSIS — D519 Vitamin B12 deficiency anemia, unspecified: Secondary | ICD-10-CM | POA: Diagnosis not present

## 2018-08-06 DIAGNOSIS — I714 Abdominal aortic aneurysm, without rupture: Secondary | ICD-10-CM | POA: Diagnosis not present

## 2018-08-06 DIAGNOSIS — Z6828 Body mass index (BMI) 28.0-28.9, adult: Secondary | ICD-10-CM | POA: Diagnosis not present

## 2018-08-06 DIAGNOSIS — E782 Mixed hyperlipidemia: Secondary | ICD-10-CM | POA: Diagnosis not present

## 2018-08-06 DIAGNOSIS — I10 Essential (primary) hypertension: Secondary | ICD-10-CM | POA: Diagnosis not present

## 2018-08-08 DIAGNOSIS — Z961 Presence of intraocular lens: Secondary | ICD-10-CM | POA: Diagnosis not present

## 2018-08-08 DIAGNOSIS — H353134 Nonexudative age-related macular degeneration, bilateral, advanced atrophic with subfoveal involvement: Secondary | ICD-10-CM | POA: Diagnosis not present

## 2018-12-10 DIAGNOSIS — M25579 Pain in unspecified ankle and joints of unspecified foot: Secondary | ICD-10-CM | POA: Diagnosis not present

## 2018-12-10 DIAGNOSIS — B351 Tinea unguium: Secondary | ICD-10-CM | POA: Diagnosis not present

## 2018-12-10 DIAGNOSIS — L600 Ingrowing nail: Secondary | ICD-10-CM | POA: Diagnosis not present

## 2018-12-10 DIAGNOSIS — M79671 Pain in right foot: Secondary | ICD-10-CM | POA: Diagnosis not present

## 2018-12-10 DIAGNOSIS — M79674 Pain in right toe(s): Secondary | ICD-10-CM | POA: Diagnosis not present

## 2018-12-10 DIAGNOSIS — M79675 Pain in left toe(s): Secondary | ICD-10-CM | POA: Diagnosis not present

## 2018-12-10 DIAGNOSIS — M79672 Pain in left foot: Secondary | ICD-10-CM | POA: Diagnosis not present

## 2018-12-10 DIAGNOSIS — I739 Peripheral vascular disease, unspecified: Secondary | ICD-10-CM | POA: Diagnosis not present

## 2019-03-11 DIAGNOSIS — B351 Tinea unguium: Secondary | ICD-10-CM | POA: Diagnosis not present

## 2019-03-11 DIAGNOSIS — M79674 Pain in right toe(s): Secondary | ICD-10-CM | POA: Diagnosis not present

## 2019-03-11 DIAGNOSIS — M79675 Pain in left toe(s): Secondary | ICD-10-CM | POA: Diagnosis not present

## 2019-03-11 DIAGNOSIS — M79671 Pain in right foot: Secondary | ICD-10-CM | POA: Diagnosis not present

## 2019-03-11 DIAGNOSIS — M79672 Pain in left foot: Secondary | ICD-10-CM | POA: Diagnosis not present

## 2019-03-11 DIAGNOSIS — M25579 Pain in unspecified ankle and joints of unspecified foot: Secondary | ICD-10-CM | POA: Diagnosis not present

## 2019-03-11 DIAGNOSIS — L600 Ingrowing nail: Secondary | ICD-10-CM | POA: Diagnosis not present

## 2019-03-11 DIAGNOSIS — I739 Peripheral vascular disease, unspecified: Secondary | ICD-10-CM | POA: Diagnosis not present

## 2019-06-04 DIAGNOSIS — B351 Tinea unguium: Secondary | ICD-10-CM | POA: Diagnosis not present

## 2019-06-04 DIAGNOSIS — I739 Peripheral vascular disease, unspecified: Secondary | ICD-10-CM | POA: Diagnosis not present

## 2019-06-04 DIAGNOSIS — M79672 Pain in left foot: Secondary | ICD-10-CM | POA: Diagnosis not present

## 2019-06-04 DIAGNOSIS — M25579 Pain in unspecified ankle and joints of unspecified foot: Secondary | ICD-10-CM | POA: Diagnosis not present

## 2019-06-04 DIAGNOSIS — L600 Ingrowing nail: Secondary | ICD-10-CM | POA: Diagnosis not present

## 2019-06-04 DIAGNOSIS — M79675 Pain in left toe(s): Secondary | ICD-10-CM | POA: Diagnosis not present

## 2019-06-04 DIAGNOSIS — M79671 Pain in right foot: Secondary | ICD-10-CM | POA: Diagnosis not present

## 2019-06-04 DIAGNOSIS — M79674 Pain in right toe(s): Secondary | ICD-10-CM | POA: Diagnosis not present

## 2019-07-27 DIAGNOSIS — Z23 Encounter for immunization: Secondary | ICD-10-CM | POA: Diagnosis not present

## 2019-07-29 DIAGNOSIS — Y929 Unspecified place or not applicable: Secondary | ICD-10-CM | POA: Diagnosis not present

## 2019-07-29 DIAGNOSIS — N400 Enlarged prostate without lower urinary tract symptoms: Secondary | ICD-10-CM | POA: Diagnosis not present

## 2019-07-29 DIAGNOSIS — Z8679 Personal history of other diseases of the circulatory system: Secondary | ICD-10-CM | POA: Diagnosis not present

## 2019-07-29 DIAGNOSIS — M7989 Other specified soft tissue disorders: Secondary | ICD-10-CM | POA: Diagnosis not present

## 2019-07-29 DIAGNOSIS — N182 Chronic kidney disease, stage 2 (mild): Secondary | ICD-10-CM | POA: Diagnosis not present

## 2019-07-29 DIAGNOSIS — Z9889 Other specified postprocedural states: Secondary | ICD-10-CM | POA: Diagnosis not present

## 2019-07-29 DIAGNOSIS — N390 Urinary tract infection, site not specified: Secondary | ICD-10-CM | POA: Diagnosis not present

## 2019-07-29 DIAGNOSIS — W1830XA Fall on same level, unspecified, initial encounter: Secondary | ICD-10-CM | POA: Diagnosis not present

## 2019-07-29 DIAGNOSIS — I482 Chronic atrial fibrillation, unspecified: Secondary | ICD-10-CM | POA: Diagnosis not present

## 2019-07-29 DIAGNOSIS — E860 Dehydration: Secondary | ICD-10-CM | POA: Diagnosis not present

## 2019-07-29 DIAGNOSIS — Z87891 Personal history of nicotine dependence: Secondary | ICD-10-CM | POA: Diagnosis not present

## 2019-07-29 DIAGNOSIS — Z79899 Other long term (current) drug therapy: Secondary | ICD-10-CM | POA: Diagnosis not present

## 2019-07-29 DIAGNOSIS — S8002XA Contusion of left knee, initial encounter: Secondary | ICD-10-CM | POA: Diagnosis not present

## 2019-07-29 DIAGNOSIS — S8991XA Unspecified injury of right lower leg, initial encounter: Secondary | ICD-10-CM | POA: Diagnosis not present

## 2019-07-29 DIAGNOSIS — Z7902 Long term (current) use of antithrombotics/antiplatelets: Secondary | ICD-10-CM | POA: Diagnosis not present

## 2019-07-29 DIAGNOSIS — W1839XA Other fall on same level, initial encounter: Secondary | ICD-10-CM | POA: Diagnosis not present

## 2019-07-29 DIAGNOSIS — Z7982 Long term (current) use of aspirin: Secondary | ICD-10-CM | POA: Diagnosis not present

## 2019-07-29 DIAGNOSIS — E785 Hyperlipidemia, unspecified: Secondary | ICD-10-CM | POA: Diagnosis not present

## 2019-07-29 DIAGNOSIS — Z880 Allergy status to penicillin: Secondary | ICD-10-CM | POA: Diagnosis not present

## 2019-07-29 DIAGNOSIS — S9031XA Contusion of right foot, initial encounter: Secondary | ICD-10-CM | POA: Diagnosis not present

## 2019-07-29 DIAGNOSIS — J449 Chronic obstructive pulmonary disease, unspecified: Secondary | ICD-10-CM | POA: Diagnosis not present

## 2019-08-02 DIAGNOSIS — I129 Hypertensive chronic kidney disease with stage 1 through stage 4 chronic kidney disease, or unspecified chronic kidney disease: Secondary | ICD-10-CM | POA: Diagnosis not present

## 2019-08-02 DIAGNOSIS — N400 Enlarged prostate without lower urinary tract symptoms: Secondary | ICD-10-CM | POA: Diagnosis not present

## 2019-08-02 DIAGNOSIS — E876 Hypokalemia: Secondary | ICD-10-CM | POA: Diagnosis not present

## 2019-08-02 DIAGNOSIS — E785 Hyperlipidemia, unspecified: Secondary | ICD-10-CM | POA: Diagnosis not present

## 2019-08-02 DIAGNOSIS — H9190 Unspecified hearing loss, unspecified ear: Secondary | ICD-10-CM | POA: Diagnosis not present

## 2019-08-02 DIAGNOSIS — Z9181 History of falling: Secondary | ICD-10-CM | POA: Diagnosis not present

## 2019-08-02 DIAGNOSIS — I2510 Atherosclerotic heart disease of native coronary artery without angina pectoris: Secondary | ICD-10-CM | POA: Diagnosis not present

## 2019-08-02 DIAGNOSIS — H547 Unspecified visual loss: Secondary | ICD-10-CM | POA: Diagnosis not present

## 2019-08-02 DIAGNOSIS — Z951 Presence of aortocoronary bypass graft: Secondary | ICD-10-CM | POA: Diagnosis not present

## 2019-08-02 DIAGNOSIS — E860 Dehydration: Secondary | ICD-10-CM | POA: Diagnosis not present

## 2019-08-02 DIAGNOSIS — H3530 Unspecified macular degeneration: Secondary | ICD-10-CM | POA: Diagnosis not present

## 2019-08-02 DIAGNOSIS — G4700 Insomnia, unspecified: Secondary | ICD-10-CM | POA: Diagnosis not present

## 2019-08-02 DIAGNOSIS — I482 Chronic atrial fibrillation, unspecified: Secondary | ICD-10-CM | POA: Diagnosis not present

## 2019-08-02 DIAGNOSIS — I252 Old myocardial infarction: Secondary | ICD-10-CM | POA: Diagnosis not present

## 2019-08-02 DIAGNOSIS — I714 Abdominal aortic aneurysm, without rupture: Secondary | ICD-10-CM | POA: Diagnosis not present

## 2019-08-02 DIAGNOSIS — N182 Chronic kidney disease, stage 2 (mild): Secondary | ICD-10-CM | POA: Diagnosis not present

## 2019-08-02 DIAGNOSIS — M1711 Unilateral primary osteoarthritis, right knee: Secondary | ICD-10-CM | POA: Diagnosis not present

## 2019-08-02 DIAGNOSIS — Z7982 Long term (current) use of aspirin: Secondary | ICD-10-CM | POA: Diagnosis not present

## 2019-08-02 DIAGNOSIS — Z87891 Personal history of nicotine dependence: Secondary | ICD-10-CM | POA: Diagnosis not present

## 2019-08-06 DIAGNOSIS — N182 Chronic kidney disease, stage 2 (mild): Secondary | ICD-10-CM | POA: Diagnosis not present

## 2019-08-06 DIAGNOSIS — I482 Chronic atrial fibrillation, unspecified: Secondary | ICD-10-CM | POA: Diagnosis not present

## 2019-08-06 DIAGNOSIS — M1711 Unilateral primary osteoarthritis, right knee: Secondary | ICD-10-CM | POA: Diagnosis not present

## 2019-08-06 DIAGNOSIS — I252 Old myocardial infarction: Secondary | ICD-10-CM | POA: Diagnosis not present

## 2019-08-06 DIAGNOSIS — I2510 Atherosclerotic heart disease of native coronary artery without angina pectoris: Secondary | ICD-10-CM | POA: Diagnosis not present

## 2019-08-06 DIAGNOSIS — I129 Hypertensive chronic kidney disease with stage 1 through stage 4 chronic kidney disease, or unspecified chronic kidney disease: Secondary | ICD-10-CM | POA: Diagnosis not present

## 2019-08-07 DIAGNOSIS — I252 Old myocardial infarction: Secondary | ICD-10-CM | POA: Diagnosis not present

## 2019-08-07 DIAGNOSIS — I129 Hypertensive chronic kidney disease with stage 1 through stage 4 chronic kidney disease, or unspecified chronic kidney disease: Secondary | ICD-10-CM | POA: Diagnosis not present

## 2019-08-07 DIAGNOSIS — I482 Chronic atrial fibrillation, unspecified: Secondary | ICD-10-CM | POA: Diagnosis not present

## 2019-08-07 DIAGNOSIS — I2510 Atherosclerotic heart disease of native coronary artery without angina pectoris: Secondary | ICD-10-CM | POA: Diagnosis not present

## 2019-08-07 DIAGNOSIS — N182 Chronic kidney disease, stage 2 (mild): Secondary | ICD-10-CM | POA: Diagnosis not present

## 2019-08-07 DIAGNOSIS — M1711 Unilateral primary osteoarthritis, right knee: Secondary | ICD-10-CM | POA: Diagnosis not present

## 2019-08-09 DIAGNOSIS — I252 Old myocardial infarction: Secondary | ICD-10-CM | POA: Diagnosis not present

## 2019-08-09 DIAGNOSIS — I129 Hypertensive chronic kidney disease with stage 1 through stage 4 chronic kidney disease, or unspecified chronic kidney disease: Secondary | ICD-10-CM | POA: Diagnosis not present

## 2019-08-09 DIAGNOSIS — I482 Chronic atrial fibrillation, unspecified: Secondary | ICD-10-CM | POA: Diagnosis not present

## 2019-08-09 DIAGNOSIS — N182 Chronic kidney disease, stage 2 (mild): Secondary | ICD-10-CM | POA: Diagnosis not present

## 2019-08-09 DIAGNOSIS — I2510 Atherosclerotic heart disease of native coronary artery without angina pectoris: Secondary | ICD-10-CM | POA: Diagnosis not present

## 2019-08-09 DIAGNOSIS — M1711 Unilateral primary osteoarthritis, right knee: Secondary | ICD-10-CM | POA: Diagnosis not present

## 2019-08-13 DIAGNOSIS — I482 Chronic atrial fibrillation, unspecified: Secondary | ICD-10-CM | POA: Diagnosis not present

## 2019-08-13 DIAGNOSIS — I252 Old myocardial infarction: Secondary | ICD-10-CM | POA: Diagnosis not present

## 2019-08-13 DIAGNOSIS — M1711 Unilateral primary osteoarthritis, right knee: Secondary | ICD-10-CM | POA: Diagnosis not present

## 2019-08-13 DIAGNOSIS — I129 Hypertensive chronic kidney disease with stage 1 through stage 4 chronic kidney disease, or unspecified chronic kidney disease: Secondary | ICD-10-CM | POA: Diagnosis not present

## 2019-08-13 DIAGNOSIS — N182 Chronic kidney disease, stage 2 (mild): Secondary | ICD-10-CM | POA: Diagnosis not present

## 2019-08-13 DIAGNOSIS — I2510 Atherosclerotic heart disease of native coronary artery without angina pectoris: Secondary | ICD-10-CM | POA: Diagnosis not present

## 2019-08-14 DIAGNOSIS — I129 Hypertensive chronic kidney disease with stage 1 through stage 4 chronic kidney disease, or unspecified chronic kidney disease: Secondary | ICD-10-CM | POA: Diagnosis not present

## 2019-08-14 DIAGNOSIS — N182 Chronic kidney disease, stage 2 (mild): Secondary | ICD-10-CM | POA: Diagnosis not present

## 2019-08-14 DIAGNOSIS — I482 Chronic atrial fibrillation, unspecified: Secondary | ICD-10-CM | POA: Diagnosis not present

## 2019-08-14 DIAGNOSIS — M1711 Unilateral primary osteoarthritis, right knee: Secondary | ICD-10-CM | POA: Diagnosis not present

## 2019-08-14 DIAGNOSIS — I252 Old myocardial infarction: Secondary | ICD-10-CM | POA: Diagnosis not present

## 2019-08-14 DIAGNOSIS — I2510 Atherosclerotic heart disease of native coronary artery without angina pectoris: Secondary | ICD-10-CM | POA: Diagnosis not present

## 2019-08-17 DIAGNOSIS — I129 Hypertensive chronic kidney disease with stage 1 through stage 4 chronic kidney disease, or unspecified chronic kidney disease: Secondary | ICD-10-CM | POA: Diagnosis not present

## 2019-08-17 DIAGNOSIS — I482 Chronic atrial fibrillation, unspecified: Secondary | ICD-10-CM | POA: Diagnosis not present

## 2019-08-17 DIAGNOSIS — N182 Chronic kidney disease, stage 2 (mild): Secondary | ICD-10-CM | POA: Diagnosis not present

## 2019-08-17 DIAGNOSIS — M1711 Unilateral primary osteoarthritis, right knee: Secondary | ICD-10-CM | POA: Diagnosis not present

## 2019-08-17 DIAGNOSIS — I252 Old myocardial infarction: Secondary | ICD-10-CM | POA: Diagnosis not present

## 2019-08-17 DIAGNOSIS — I2510 Atherosclerotic heart disease of native coronary artery without angina pectoris: Secondary | ICD-10-CM | POA: Diagnosis not present

## 2019-08-21 DIAGNOSIS — I129 Hypertensive chronic kidney disease with stage 1 through stage 4 chronic kidney disease, or unspecified chronic kidney disease: Secondary | ICD-10-CM | POA: Diagnosis not present

## 2019-08-21 DIAGNOSIS — N182 Chronic kidney disease, stage 2 (mild): Secondary | ICD-10-CM | POA: Diagnosis not present

## 2019-08-21 DIAGNOSIS — M1711 Unilateral primary osteoarthritis, right knee: Secondary | ICD-10-CM | POA: Diagnosis not present

## 2019-08-21 DIAGNOSIS — I252 Old myocardial infarction: Secondary | ICD-10-CM | POA: Diagnosis not present

## 2019-08-21 DIAGNOSIS — I2510 Atherosclerotic heart disease of native coronary artery without angina pectoris: Secondary | ICD-10-CM | POA: Diagnosis not present

## 2019-08-21 DIAGNOSIS — I482 Chronic atrial fibrillation, unspecified: Secondary | ICD-10-CM | POA: Diagnosis not present

## 2019-08-22 DIAGNOSIS — I2510 Atherosclerotic heart disease of native coronary artery without angina pectoris: Secondary | ICD-10-CM | POA: Diagnosis not present

## 2019-08-22 DIAGNOSIS — N182 Chronic kidney disease, stage 2 (mild): Secondary | ICD-10-CM | POA: Diagnosis not present

## 2019-08-22 DIAGNOSIS — I482 Chronic atrial fibrillation, unspecified: Secondary | ICD-10-CM | POA: Diagnosis not present

## 2019-08-22 DIAGNOSIS — M1711 Unilateral primary osteoarthritis, right knee: Secondary | ICD-10-CM | POA: Diagnosis not present

## 2019-08-22 DIAGNOSIS — I129 Hypertensive chronic kidney disease with stage 1 through stage 4 chronic kidney disease, or unspecified chronic kidney disease: Secondary | ICD-10-CM | POA: Diagnosis not present

## 2019-08-22 DIAGNOSIS — I252 Old myocardial infarction: Secondary | ICD-10-CM | POA: Diagnosis not present

## 2019-08-24 DIAGNOSIS — I482 Chronic atrial fibrillation, unspecified: Secondary | ICD-10-CM | POA: Diagnosis not present

## 2019-08-24 DIAGNOSIS — I252 Old myocardial infarction: Secondary | ICD-10-CM | POA: Diagnosis not present

## 2019-08-24 DIAGNOSIS — Z23 Encounter for immunization: Secondary | ICD-10-CM | POA: Diagnosis not present

## 2019-08-24 DIAGNOSIS — N182 Chronic kidney disease, stage 2 (mild): Secondary | ICD-10-CM | POA: Diagnosis not present

## 2019-08-24 DIAGNOSIS — I129 Hypertensive chronic kidney disease with stage 1 through stage 4 chronic kidney disease, or unspecified chronic kidney disease: Secondary | ICD-10-CM | POA: Diagnosis not present

## 2019-08-24 DIAGNOSIS — I2510 Atherosclerotic heart disease of native coronary artery without angina pectoris: Secondary | ICD-10-CM | POA: Diagnosis not present

## 2019-08-24 DIAGNOSIS — M1711 Unilateral primary osteoarthritis, right knee: Secondary | ICD-10-CM | POA: Diagnosis not present

## 2019-08-27 DIAGNOSIS — I129 Hypertensive chronic kidney disease with stage 1 through stage 4 chronic kidney disease, or unspecified chronic kidney disease: Secondary | ICD-10-CM | POA: Diagnosis not present

## 2019-08-27 DIAGNOSIS — N182 Chronic kidney disease, stage 2 (mild): Secondary | ICD-10-CM | POA: Diagnosis not present

## 2019-08-27 DIAGNOSIS — I2510 Atherosclerotic heart disease of native coronary artery without angina pectoris: Secondary | ICD-10-CM | POA: Diagnosis not present

## 2019-08-27 DIAGNOSIS — I482 Chronic atrial fibrillation, unspecified: Secondary | ICD-10-CM | POA: Diagnosis not present

## 2019-08-27 DIAGNOSIS — M1711 Unilateral primary osteoarthritis, right knee: Secondary | ICD-10-CM | POA: Diagnosis not present

## 2019-08-27 DIAGNOSIS — I252 Old myocardial infarction: Secondary | ICD-10-CM | POA: Diagnosis not present

## 2019-08-28 DIAGNOSIS — I2510 Atherosclerotic heart disease of native coronary artery without angina pectoris: Secondary | ICD-10-CM | POA: Diagnosis not present

## 2019-08-28 DIAGNOSIS — I252 Old myocardial infarction: Secondary | ICD-10-CM | POA: Diagnosis not present

## 2019-08-28 DIAGNOSIS — I482 Chronic atrial fibrillation, unspecified: Secondary | ICD-10-CM | POA: Diagnosis not present

## 2019-08-28 DIAGNOSIS — N182 Chronic kidney disease, stage 2 (mild): Secondary | ICD-10-CM | POA: Diagnosis not present

## 2019-08-28 DIAGNOSIS — I129 Hypertensive chronic kidney disease with stage 1 through stage 4 chronic kidney disease, or unspecified chronic kidney disease: Secondary | ICD-10-CM | POA: Diagnosis not present

## 2019-08-28 DIAGNOSIS — M1711 Unilateral primary osteoarthritis, right knee: Secondary | ICD-10-CM | POA: Diagnosis not present

## 2019-08-29 DIAGNOSIS — I482 Chronic atrial fibrillation, unspecified: Secondary | ICD-10-CM | POA: Diagnosis not present

## 2019-08-29 DIAGNOSIS — I252 Old myocardial infarction: Secondary | ICD-10-CM | POA: Diagnosis not present

## 2019-08-29 DIAGNOSIS — N182 Chronic kidney disease, stage 2 (mild): Secondary | ICD-10-CM | POA: Diagnosis not present

## 2019-08-29 DIAGNOSIS — I2510 Atherosclerotic heart disease of native coronary artery without angina pectoris: Secondary | ICD-10-CM | POA: Diagnosis not present

## 2019-08-29 DIAGNOSIS — I129 Hypertensive chronic kidney disease with stage 1 through stage 4 chronic kidney disease, or unspecified chronic kidney disease: Secondary | ICD-10-CM | POA: Diagnosis not present

## 2019-08-29 DIAGNOSIS — M1711 Unilateral primary osteoarthritis, right knee: Secondary | ICD-10-CM | POA: Diagnosis not present

## 2019-09-01 DIAGNOSIS — Z951 Presence of aortocoronary bypass graft: Secondary | ICD-10-CM | POA: Diagnosis not present

## 2019-09-01 DIAGNOSIS — E860 Dehydration: Secondary | ICD-10-CM | POA: Diagnosis not present

## 2019-09-01 DIAGNOSIS — Z7982 Long term (current) use of aspirin: Secondary | ICD-10-CM | POA: Diagnosis not present

## 2019-09-01 DIAGNOSIS — I129 Hypertensive chronic kidney disease with stage 1 through stage 4 chronic kidney disease, or unspecified chronic kidney disease: Secondary | ICD-10-CM | POA: Diagnosis not present

## 2019-09-01 DIAGNOSIS — H3530 Unspecified macular degeneration: Secondary | ICD-10-CM | POA: Diagnosis not present

## 2019-09-01 DIAGNOSIS — Z87891 Personal history of nicotine dependence: Secondary | ICD-10-CM | POA: Diagnosis not present

## 2019-09-01 DIAGNOSIS — N182 Chronic kidney disease, stage 2 (mild): Secondary | ICD-10-CM | POA: Diagnosis not present

## 2019-09-01 DIAGNOSIS — I252 Old myocardial infarction: Secondary | ICD-10-CM | POA: Diagnosis not present

## 2019-09-01 DIAGNOSIS — I714 Abdominal aortic aneurysm, without rupture: Secondary | ICD-10-CM | POA: Diagnosis not present

## 2019-09-01 DIAGNOSIS — E785 Hyperlipidemia, unspecified: Secondary | ICD-10-CM | POA: Diagnosis not present

## 2019-09-01 DIAGNOSIS — H547 Unspecified visual loss: Secondary | ICD-10-CM | POA: Diagnosis not present

## 2019-09-01 DIAGNOSIS — M1711 Unilateral primary osteoarthritis, right knee: Secondary | ICD-10-CM | POA: Diagnosis not present

## 2019-09-01 DIAGNOSIS — G4700 Insomnia, unspecified: Secondary | ICD-10-CM | POA: Diagnosis not present

## 2019-09-01 DIAGNOSIS — I482 Chronic atrial fibrillation, unspecified: Secondary | ICD-10-CM | POA: Diagnosis not present

## 2019-09-01 DIAGNOSIS — N400 Enlarged prostate without lower urinary tract symptoms: Secondary | ICD-10-CM | POA: Diagnosis not present

## 2019-09-01 DIAGNOSIS — H9190 Unspecified hearing loss, unspecified ear: Secondary | ICD-10-CM | POA: Diagnosis not present

## 2019-09-01 DIAGNOSIS — I2510 Atherosclerotic heart disease of native coronary artery without angina pectoris: Secondary | ICD-10-CM | POA: Diagnosis not present

## 2019-09-01 DIAGNOSIS — Z9181 History of falling: Secondary | ICD-10-CM | POA: Diagnosis not present

## 2019-09-01 DIAGNOSIS — E876 Hypokalemia: Secondary | ICD-10-CM | POA: Diagnosis not present

## 2019-09-03 DIAGNOSIS — I129 Hypertensive chronic kidney disease with stage 1 through stage 4 chronic kidney disease, or unspecified chronic kidney disease: Secondary | ICD-10-CM | POA: Diagnosis not present

## 2019-09-03 DIAGNOSIS — N182 Chronic kidney disease, stage 2 (mild): Secondary | ICD-10-CM | POA: Diagnosis not present

## 2019-09-03 DIAGNOSIS — I2510 Atherosclerotic heart disease of native coronary artery without angina pectoris: Secondary | ICD-10-CM | POA: Diagnosis not present

## 2019-09-03 DIAGNOSIS — M1711 Unilateral primary osteoarthritis, right knee: Secondary | ICD-10-CM | POA: Diagnosis not present

## 2019-09-03 DIAGNOSIS — I252 Old myocardial infarction: Secondary | ICD-10-CM | POA: Diagnosis not present

## 2019-09-03 DIAGNOSIS — I482 Chronic atrial fibrillation, unspecified: Secondary | ICD-10-CM | POA: Diagnosis not present

## 2019-09-04 DIAGNOSIS — I129 Hypertensive chronic kidney disease with stage 1 through stage 4 chronic kidney disease, or unspecified chronic kidney disease: Secondary | ICD-10-CM | POA: Diagnosis not present

## 2019-09-04 DIAGNOSIS — I252 Old myocardial infarction: Secondary | ICD-10-CM | POA: Diagnosis not present

## 2019-09-04 DIAGNOSIS — I482 Chronic atrial fibrillation, unspecified: Secondary | ICD-10-CM | POA: Diagnosis not present

## 2019-09-04 DIAGNOSIS — N182 Chronic kidney disease, stage 2 (mild): Secondary | ICD-10-CM | POA: Diagnosis not present

## 2019-09-04 DIAGNOSIS — I2510 Atherosclerotic heart disease of native coronary artery without angina pectoris: Secondary | ICD-10-CM | POA: Diagnosis not present

## 2019-09-04 DIAGNOSIS — M1711 Unilateral primary osteoarthritis, right knee: Secondary | ICD-10-CM | POA: Diagnosis not present

## 2019-09-06 DIAGNOSIS — N182 Chronic kidney disease, stage 2 (mild): Secondary | ICD-10-CM | POA: Diagnosis not present

## 2019-09-06 DIAGNOSIS — M1711 Unilateral primary osteoarthritis, right knee: Secondary | ICD-10-CM | POA: Diagnosis not present

## 2019-09-06 DIAGNOSIS — I129 Hypertensive chronic kidney disease with stage 1 through stage 4 chronic kidney disease, or unspecified chronic kidney disease: Secondary | ICD-10-CM | POA: Diagnosis not present

## 2019-09-06 DIAGNOSIS — I2510 Atherosclerotic heart disease of native coronary artery without angina pectoris: Secondary | ICD-10-CM | POA: Diagnosis not present

## 2019-09-06 DIAGNOSIS — I482 Chronic atrial fibrillation, unspecified: Secondary | ICD-10-CM | POA: Diagnosis not present

## 2019-09-06 DIAGNOSIS — I252 Old myocardial infarction: Secondary | ICD-10-CM | POA: Diagnosis not present

## 2019-09-10 DIAGNOSIS — N182 Chronic kidney disease, stage 2 (mild): Secondary | ICD-10-CM | POA: Diagnosis not present

## 2019-09-10 DIAGNOSIS — M1711 Unilateral primary osteoarthritis, right knee: Secondary | ICD-10-CM | POA: Diagnosis not present

## 2019-09-10 DIAGNOSIS — I129 Hypertensive chronic kidney disease with stage 1 through stage 4 chronic kidney disease, or unspecified chronic kidney disease: Secondary | ICD-10-CM | POA: Diagnosis not present

## 2019-09-10 DIAGNOSIS — I252 Old myocardial infarction: Secondary | ICD-10-CM | POA: Diagnosis not present

## 2019-09-10 DIAGNOSIS — I2510 Atherosclerotic heart disease of native coronary artery without angina pectoris: Secondary | ICD-10-CM | POA: Diagnosis not present

## 2019-09-10 DIAGNOSIS — I482 Chronic atrial fibrillation, unspecified: Secondary | ICD-10-CM | POA: Diagnosis not present

## 2019-09-13 DIAGNOSIS — I2510 Atherosclerotic heart disease of native coronary artery without angina pectoris: Secondary | ICD-10-CM | POA: Diagnosis not present

## 2019-09-13 DIAGNOSIS — M1711 Unilateral primary osteoarthritis, right knee: Secondary | ICD-10-CM | POA: Diagnosis not present

## 2019-09-13 DIAGNOSIS — I129 Hypertensive chronic kidney disease with stage 1 through stage 4 chronic kidney disease, or unspecified chronic kidney disease: Secondary | ICD-10-CM | POA: Diagnosis not present

## 2019-09-13 DIAGNOSIS — I252 Old myocardial infarction: Secondary | ICD-10-CM | POA: Diagnosis not present

## 2019-09-13 DIAGNOSIS — I482 Chronic atrial fibrillation, unspecified: Secondary | ICD-10-CM | POA: Diagnosis not present

## 2019-09-13 DIAGNOSIS — N182 Chronic kidney disease, stage 2 (mild): Secondary | ICD-10-CM | POA: Diagnosis not present

## 2019-09-18 DIAGNOSIS — I252 Old myocardial infarction: Secondary | ICD-10-CM | POA: Diagnosis not present

## 2019-09-18 DIAGNOSIS — N182 Chronic kidney disease, stage 2 (mild): Secondary | ICD-10-CM | POA: Diagnosis not present

## 2019-09-18 DIAGNOSIS — M1711 Unilateral primary osteoarthritis, right knee: Secondary | ICD-10-CM | POA: Diagnosis not present

## 2019-09-18 DIAGNOSIS — I129 Hypertensive chronic kidney disease with stage 1 through stage 4 chronic kidney disease, or unspecified chronic kidney disease: Secondary | ICD-10-CM | POA: Diagnosis not present

## 2019-09-18 DIAGNOSIS — I2510 Atherosclerotic heart disease of native coronary artery without angina pectoris: Secondary | ICD-10-CM | POA: Diagnosis not present

## 2019-09-18 DIAGNOSIS — I482 Chronic atrial fibrillation, unspecified: Secondary | ICD-10-CM | POA: Diagnosis not present

## 2019-09-20 DIAGNOSIS — N182 Chronic kidney disease, stage 2 (mild): Secondary | ICD-10-CM | POA: Diagnosis not present

## 2019-09-20 DIAGNOSIS — M1711 Unilateral primary osteoarthritis, right knee: Secondary | ICD-10-CM | POA: Diagnosis not present

## 2019-09-20 DIAGNOSIS — I2510 Atherosclerotic heart disease of native coronary artery without angina pectoris: Secondary | ICD-10-CM | POA: Diagnosis not present

## 2019-09-20 DIAGNOSIS — I482 Chronic atrial fibrillation, unspecified: Secondary | ICD-10-CM | POA: Diagnosis not present

## 2019-09-20 DIAGNOSIS — I129 Hypertensive chronic kidney disease with stage 1 through stage 4 chronic kidney disease, or unspecified chronic kidney disease: Secondary | ICD-10-CM | POA: Diagnosis not present

## 2019-09-20 DIAGNOSIS — I252 Old myocardial infarction: Secondary | ICD-10-CM | POA: Diagnosis not present

## 2019-09-24 DIAGNOSIS — I2510 Atherosclerotic heart disease of native coronary artery without angina pectoris: Secondary | ICD-10-CM | POA: Diagnosis not present

## 2019-09-24 DIAGNOSIS — I482 Chronic atrial fibrillation, unspecified: Secondary | ICD-10-CM | POA: Diagnosis not present

## 2019-09-24 DIAGNOSIS — I252 Old myocardial infarction: Secondary | ICD-10-CM | POA: Diagnosis not present

## 2019-09-24 DIAGNOSIS — I129 Hypertensive chronic kidney disease with stage 1 through stage 4 chronic kidney disease, or unspecified chronic kidney disease: Secondary | ICD-10-CM | POA: Diagnosis not present

## 2019-09-24 DIAGNOSIS — M1711 Unilateral primary osteoarthritis, right knee: Secondary | ICD-10-CM | POA: Diagnosis not present

## 2019-09-24 DIAGNOSIS — N182 Chronic kidney disease, stage 2 (mild): Secondary | ICD-10-CM | POA: Diagnosis not present

## 2019-09-25 DIAGNOSIS — Z13220 Encounter for screening for lipoid disorders: Secondary | ICD-10-CM | POA: Diagnosis not present

## 2019-09-25 DIAGNOSIS — I482 Chronic atrial fibrillation, unspecified: Secondary | ICD-10-CM | POA: Diagnosis not present

## 2019-09-25 DIAGNOSIS — H6120 Impacted cerumen, unspecified ear: Secondary | ICD-10-CM | POA: Diagnosis not present

## 2019-09-25 DIAGNOSIS — I10 Essential (primary) hypertension: Secondary | ICD-10-CM | POA: Diagnosis not present

## 2019-09-25 DIAGNOSIS — R946 Abnormal results of thyroid function studies: Secondary | ICD-10-CM | POA: Diagnosis not present

## 2019-09-25 DIAGNOSIS — N182 Chronic kidney disease, stage 2 (mild): Secondary | ICD-10-CM | POA: Diagnosis not present

## 2019-09-25 DIAGNOSIS — Z6824 Body mass index (BMI) 24.0-24.9, adult: Secondary | ICD-10-CM | POA: Diagnosis not present

## 2019-09-25 DIAGNOSIS — N390 Urinary tract infection, site not specified: Secondary | ICD-10-CM | POA: Diagnosis not present

## 2019-09-25 DIAGNOSIS — I714 Abdominal aortic aneurysm, without rupture: Secondary | ICD-10-CM | POA: Diagnosis not present

## 2019-09-25 DIAGNOSIS — J439 Emphysema, unspecified: Secondary | ICD-10-CM | POA: Diagnosis not present

## 2019-09-27 DIAGNOSIS — I2510 Atherosclerotic heart disease of native coronary artery without angina pectoris: Secondary | ICD-10-CM | POA: Diagnosis not present

## 2019-09-27 DIAGNOSIS — M1711 Unilateral primary osteoarthritis, right knee: Secondary | ICD-10-CM | POA: Diagnosis not present

## 2019-09-27 DIAGNOSIS — I129 Hypertensive chronic kidney disease with stage 1 through stage 4 chronic kidney disease, or unspecified chronic kidney disease: Secondary | ICD-10-CM | POA: Diagnosis not present

## 2019-09-27 DIAGNOSIS — N182 Chronic kidney disease, stage 2 (mild): Secondary | ICD-10-CM | POA: Diagnosis not present

## 2019-09-27 DIAGNOSIS — I482 Chronic atrial fibrillation, unspecified: Secondary | ICD-10-CM | POA: Diagnosis not present

## 2019-09-27 DIAGNOSIS — I252 Old myocardial infarction: Secondary | ICD-10-CM | POA: Diagnosis not present

## 2019-10-01 DIAGNOSIS — L600 Ingrowing nail: Secondary | ICD-10-CM | POA: Diagnosis not present

## 2019-10-01 DIAGNOSIS — I482 Chronic atrial fibrillation, unspecified: Secondary | ICD-10-CM | POA: Diagnosis not present

## 2019-10-01 DIAGNOSIS — N400 Enlarged prostate without lower urinary tract symptoms: Secondary | ICD-10-CM | POA: Diagnosis not present

## 2019-10-01 DIAGNOSIS — I714 Abdominal aortic aneurysm, without rupture: Secondary | ICD-10-CM | POA: Diagnosis not present

## 2019-10-01 DIAGNOSIS — H9190 Unspecified hearing loss, unspecified ear: Secondary | ICD-10-CM | POA: Diagnosis not present

## 2019-10-01 DIAGNOSIS — B351 Tinea unguium: Secondary | ICD-10-CM | POA: Diagnosis not present

## 2019-10-01 DIAGNOSIS — H3530 Unspecified macular degeneration: Secondary | ICD-10-CM | POA: Diagnosis not present

## 2019-10-01 DIAGNOSIS — I129 Hypertensive chronic kidney disease with stage 1 through stage 4 chronic kidney disease, or unspecified chronic kidney disease: Secondary | ICD-10-CM | POA: Diagnosis not present

## 2019-10-01 DIAGNOSIS — M25579 Pain in unspecified ankle and joints of unspecified foot: Secondary | ICD-10-CM | POA: Diagnosis not present

## 2019-10-01 DIAGNOSIS — M79675 Pain in left toe(s): Secondary | ICD-10-CM | POA: Diagnosis not present

## 2019-10-01 DIAGNOSIS — I739 Peripheral vascular disease, unspecified: Secondary | ICD-10-CM | POA: Diagnosis not present

## 2019-10-01 DIAGNOSIS — Z87891 Personal history of nicotine dependence: Secondary | ICD-10-CM | POA: Diagnosis not present

## 2019-10-01 DIAGNOSIS — N182 Chronic kidney disease, stage 2 (mild): Secondary | ICD-10-CM | POA: Diagnosis not present

## 2019-10-01 DIAGNOSIS — Z9181 History of falling: Secondary | ICD-10-CM | POA: Diagnosis not present

## 2019-10-01 DIAGNOSIS — M79672 Pain in left foot: Secondary | ICD-10-CM | POA: Diagnosis not present

## 2019-10-01 DIAGNOSIS — M79674 Pain in right toe(s): Secondary | ICD-10-CM | POA: Diagnosis not present

## 2019-10-01 DIAGNOSIS — E785 Hyperlipidemia, unspecified: Secondary | ICD-10-CM | POA: Diagnosis not present

## 2019-10-01 DIAGNOSIS — M79671 Pain in right foot: Secondary | ICD-10-CM | POA: Diagnosis not present

## 2019-10-01 DIAGNOSIS — M1711 Unilateral primary osteoarthritis, right knee: Secondary | ICD-10-CM | POA: Diagnosis not present

## 2019-10-01 DIAGNOSIS — I2510 Atherosclerotic heart disease of native coronary artery without angina pectoris: Secondary | ICD-10-CM | POA: Diagnosis not present

## 2019-10-01 DIAGNOSIS — I252 Old myocardial infarction: Secondary | ICD-10-CM | POA: Diagnosis not present

## 2019-10-01 DIAGNOSIS — H547 Unspecified visual loss: Secondary | ICD-10-CM | POA: Diagnosis not present

## 2019-10-02 DIAGNOSIS — N182 Chronic kidney disease, stage 2 (mild): Secondary | ICD-10-CM | POA: Diagnosis not present

## 2019-10-02 DIAGNOSIS — M1711 Unilateral primary osteoarthritis, right knee: Secondary | ICD-10-CM | POA: Diagnosis not present

## 2019-10-02 DIAGNOSIS — I252 Old myocardial infarction: Secondary | ICD-10-CM | POA: Diagnosis not present

## 2019-10-02 DIAGNOSIS — I482 Chronic atrial fibrillation, unspecified: Secondary | ICD-10-CM | POA: Diagnosis not present

## 2019-10-02 DIAGNOSIS — I129 Hypertensive chronic kidney disease with stage 1 through stage 4 chronic kidney disease, or unspecified chronic kidney disease: Secondary | ICD-10-CM | POA: Diagnosis not present

## 2019-10-02 DIAGNOSIS — I2510 Atherosclerotic heart disease of native coronary artery without angina pectoris: Secondary | ICD-10-CM | POA: Diagnosis not present

## 2019-10-08 DIAGNOSIS — N182 Chronic kidney disease, stage 2 (mild): Secondary | ICD-10-CM | POA: Diagnosis not present

## 2019-10-08 DIAGNOSIS — I482 Chronic atrial fibrillation, unspecified: Secondary | ICD-10-CM | POA: Diagnosis not present

## 2019-10-08 DIAGNOSIS — I129 Hypertensive chronic kidney disease with stage 1 through stage 4 chronic kidney disease, or unspecified chronic kidney disease: Secondary | ICD-10-CM | POA: Diagnosis not present

## 2019-10-08 DIAGNOSIS — I252 Old myocardial infarction: Secondary | ICD-10-CM | POA: Diagnosis not present

## 2019-10-08 DIAGNOSIS — I2510 Atherosclerotic heart disease of native coronary artery without angina pectoris: Secondary | ICD-10-CM | POA: Diagnosis not present

## 2019-10-08 DIAGNOSIS — M1711 Unilateral primary osteoarthritis, right knee: Secondary | ICD-10-CM | POA: Diagnosis not present

## 2019-10-15 DIAGNOSIS — M1711 Unilateral primary osteoarthritis, right knee: Secondary | ICD-10-CM | POA: Diagnosis not present

## 2019-10-15 DIAGNOSIS — I2510 Atherosclerotic heart disease of native coronary artery without angina pectoris: Secondary | ICD-10-CM | POA: Diagnosis not present

## 2019-10-15 DIAGNOSIS — I129 Hypertensive chronic kidney disease with stage 1 through stage 4 chronic kidney disease, or unspecified chronic kidney disease: Secondary | ICD-10-CM | POA: Diagnosis not present

## 2019-10-15 DIAGNOSIS — I482 Chronic atrial fibrillation, unspecified: Secondary | ICD-10-CM | POA: Diagnosis not present

## 2019-10-15 DIAGNOSIS — N182 Chronic kidney disease, stage 2 (mild): Secondary | ICD-10-CM | POA: Diagnosis not present

## 2019-10-15 DIAGNOSIS — I252 Old myocardial infarction: Secondary | ICD-10-CM | POA: Diagnosis not present

## 2019-10-22 DIAGNOSIS — I2510 Atherosclerotic heart disease of native coronary artery without angina pectoris: Secondary | ICD-10-CM | POA: Diagnosis not present

## 2019-10-22 DIAGNOSIS — I252 Old myocardial infarction: Secondary | ICD-10-CM | POA: Diagnosis not present

## 2019-10-22 DIAGNOSIS — M1711 Unilateral primary osteoarthritis, right knee: Secondary | ICD-10-CM | POA: Diagnosis not present

## 2019-10-22 DIAGNOSIS — N182 Chronic kidney disease, stage 2 (mild): Secondary | ICD-10-CM | POA: Diagnosis not present

## 2019-10-22 DIAGNOSIS — I482 Chronic atrial fibrillation, unspecified: Secondary | ICD-10-CM | POA: Diagnosis not present

## 2019-10-22 DIAGNOSIS — I129 Hypertensive chronic kidney disease with stage 1 through stage 4 chronic kidney disease, or unspecified chronic kidney disease: Secondary | ICD-10-CM | POA: Diagnosis not present

## 2019-10-31 DIAGNOSIS — Z87891 Personal history of nicotine dependence: Secondary | ICD-10-CM | POA: Diagnosis not present

## 2019-10-31 DIAGNOSIS — E785 Hyperlipidemia, unspecified: Secondary | ICD-10-CM | POA: Diagnosis not present

## 2019-10-31 DIAGNOSIS — H3530 Unspecified macular degeneration: Secondary | ICD-10-CM | POA: Diagnosis not present

## 2019-10-31 DIAGNOSIS — Z9181 History of falling: Secondary | ICD-10-CM | POA: Diagnosis not present

## 2019-10-31 DIAGNOSIS — I2510 Atherosclerotic heart disease of native coronary artery without angina pectoris: Secondary | ICD-10-CM | POA: Diagnosis not present

## 2019-10-31 DIAGNOSIS — I482 Chronic atrial fibrillation, unspecified: Secondary | ICD-10-CM | POA: Diagnosis not present

## 2019-10-31 DIAGNOSIS — H547 Unspecified visual loss: Secondary | ICD-10-CM | POA: Diagnosis not present

## 2019-10-31 DIAGNOSIS — N400 Enlarged prostate without lower urinary tract symptoms: Secondary | ICD-10-CM | POA: Diagnosis not present

## 2019-10-31 DIAGNOSIS — N182 Chronic kidney disease, stage 2 (mild): Secondary | ICD-10-CM | POA: Diagnosis not present

## 2019-10-31 DIAGNOSIS — H9190 Unspecified hearing loss, unspecified ear: Secondary | ICD-10-CM | POA: Diagnosis not present

## 2019-10-31 DIAGNOSIS — I714 Abdominal aortic aneurysm, without rupture: Secondary | ICD-10-CM | POA: Diagnosis not present

## 2019-10-31 DIAGNOSIS — M1711 Unilateral primary osteoarthritis, right knee: Secondary | ICD-10-CM | POA: Diagnosis not present

## 2019-10-31 DIAGNOSIS — I129 Hypertensive chronic kidney disease with stage 1 through stage 4 chronic kidney disease, or unspecified chronic kidney disease: Secondary | ICD-10-CM | POA: Diagnosis not present

## 2019-10-31 DIAGNOSIS — I252 Old myocardial infarction: Secondary | ICD-10-CM | POA: Diagnosis not present

## 2019-11-05 DIAGNOSIS — R300 Dysuria: Secondary | ICD-10-CM | POA: Diagnosis not present

## 2019-11-07 DIAGNOSIS — I129 Hypertensive chronic kidney disease with stage 1 through stage 4 chronic kidney disease, or unspecified chronic kidney disease: Secondary | ICD-10-CM | POA: Diagnosis not present

## 2019-11-07 DIAGNOSIS — N182 Chronic kidney disease, stage 2 (mild): Secondary | ICD-10-CM | POA: Diagnosis not present

## 2019-11-07 DIAGNOSIS — I482 Chronic atrial fibrillation, unspecified: Secondary | ICD-10-CM | POA: Diagnosis not present

## 2019-11-07 DIAGNOSIS — I252 Old myocardial infarction: Secondary | ICD-10-CM | POA: Diagnosis not present

## 2019-11-07 DIAGNOSIS — I2510 Atherosclerotic heart disease of native coronary artery without angina pectoris: Secondary | ICD-10-CM | POA: Diagnosis not present

## 2019-11-07 DIAGNOSIS — M1711 Unilateral primary osteoarthritis, right knee: Secondary | ICD-10-CM | POA: Diagnosis not present

## 2019-11-14 DIAGNOSIS — I2510 Atherosclerotic heart disease of native coronary artery without angina pectoris: Secondary | ICD-10-CM | POA: Diagnosis not present

## 2019-11-14 DIAGNOSIS — I482 Chronic atrial fibrillation, unspecified: Secondary | ICD-10-CM | POA: Diagnosis not present

## 2019-11-14 DIAGNOSIS — N182 Chronic kidney disease, stage 2 (mild): Secondary | ICD-10-CM | POA: Diagnosis not present

## 2019-11-14 DIAGNOSIS — I129 Hypertensive chronic kidney disease with stage 1 through stage 4 chronic kidney disease, or unspecified chronic kidney disease: Secondary | ICD-10-CM | POA: Diagnosis not present

## 2019-11-14 DIAGNOSIS — I252 Old myocardial infarction: Secondary | ICD-10-CM | POA: Diagnosis not present

## 2019-11-14 DIAGNOSIS — M1711 Unilateral primary osteoarthritis, right knee: Secondary | ICD-10-CM | POA: Diagnosis not present

## 2019-11-21 DIAGNOSIS — N182 Chronic kidney disease, stage 2 (mild): Secondary | ICD-10-CM | POA: Diagnosis not present

## 2019-11-21 DIAGNOSIS — I129 Hypertensive chronic kidney disease with stage 1 through stage 4 chronic kidney disease, or unspecified chronic kidney disease: Secondary | ICD-10-CM | POA: Diagnosis not present

## 2019-11-21 DIAGNOSIS — I252 Old myocardial infarction: Secondary | ICD-10-CM | POA: Diagnosis not present

## 2019-11-21 DIAGNOSIS — M1711 Unilateral primary osteoarthritis, right knee: Secondary | ICD-10-CM | POA: Diagnosis not present

## 2019-11-21 DIAGNOSIS — I2510 Atherosclerotic heart disease of native coronary artery without angina pectoris: Secondary | ICD-10-CM | POA: Diagnosis not present

## 2019-11-21 DIAGNOSIS — I482 Chronic atrial fibrillation, unspecified: Secondary | ICD-10-CM | POA: Diagnosis not present

## 2019-11-28 DIAGNOSIS — M1711 Unilateral primary osteoarthritis, right knee: Secondary | ICD-10-CM | POA: Diagnosis not present

## 2019-11-28 DIAGNOSIS — I2510 Atherosclerotic heart disease of native coronary artery without angina pectoris: Secondary | ICD-10-CM | POA: Diagnosis not present

## 2019-11-28 DIAGNOSIS — I482 Chronic atrial fibrillation, unspecified: Secondary | ICD-10-CM | POA: Diagnosis not present

## 2019-11-28 DIAGNOSIS — N182 Chronic kidney disease, stage 2 (mild): Secondary | ICD-10-CM | POA: Diagnosis not present

## 2019-11-28 DIAGNOSIS — I129 Hypertensive chronic kidney disease with stage 1 through stage 4 chronic kidney disease, or unspecified chronic kidney disease: Secondary | ICD-10-CM | POA: Diagnosis not present

## 2019-11-28 DIAGNOSIS — I252 Old myocardial infarction: Secondary | ICD-10-CM | POA: Diagnosis not present

## 2020-03-06 DIAGNOSIS — E782 Mixed hyperlipidemia: Secondary | ICD-10-CM | POA: Diagnosis not present

## 2020-03-06 DIAGNOSIS — I10 Essential (primary) hypertension: Secondary | ICD-10-CM | POA: Diagnosis not present

## 2020-03-09 DIAGNOSIS — Z6824 Body mass index (BMI) 24.0-24.9, adult: Secondary | ICD-10-CM | POA: Diagnosis not present

## 2020-03-09 DIAGNOSIS — I482 Chronic atrial fibrillation, unspecified: Secondary | ICD-10-CM | POA: Diagnosis not present

## 2020-03-09 DIAGNOSIS — J439 Emphysema, unspecified: Secondary | ICD-10-CM | POA: Diagnosis not present

## 2020-03-09 DIAGNOSIS — I714 Abdominal aortic aneurysm, without rupture: Secondary | ICD-10-CM | POA: Diagnosis not present

## 2020-03-09 DIAGNOSIS — Z0001 Encounter for general adult medical examination with abnormal findings: Secondary | ICD-10-CM | POA: Diagnosis not present

## 2020-03-09 DIAGNOSIS — N182 Chronic kidney disease, stage 2 (mild): Secondary | ICD-10-CM | POA: Diagnosis not present

## 2020-03-09 DIAGNOSIS — I10 Essential (primary) hypertension: Secondary | ICD-10-CM | POA: Diagnosis not present

## 2020-03-09 DIAGNOSIS — E782 Mixed hyperlipidemia: Secondary | ICD-10-CM | POA: Diagnosis not present

## 2020-03-10 DIAGNOSIS — M79675 Pain in left toe(s): Secondary | ICD-10-CM | POA: Diagnosis not present

## 2020-03-10 DIAGNOSIS — M79672 Pain in left foot: Secondary | ICD-10-CM | POA: Diagnosis not present

## 2020-03-10 DIAGNOSIS — I739 Peripheral vascular disease, unspecified: Secondary | ICD-10-CM | POA: Diagnosis not present

## 2020-03-10 DIAGNOSIS — M25579 Pain in unspecified ankle and joints of unspecified foot: Secondary | ICD-10-CM | POA: Diagnosis not present

## 2020-03-10 DIAGNOSIS — B351 Tinea unguium: Secondary | ICD-10-CM | POA: Diagnosis not present

## 2020-03-10 DIAGNOSIS — M79674 Pain in right toe(s): Secondary | ICD-10-CM | POA: Diagnosis not present

## 2020-03-10 DIAGNOSIS — M79671 Pain in right foot: Secondary | ICD-10-CM | POA: Diagnosis not present

## 2020-03-10 DIAGNOSIS — L600 Ingrowing nail: Secondary | ICD-10-CM | POA: Diagnosis not present

## 2020-03-12 DIAGNOSIS — Z7982 Long term (current) use of aspirin: Secondary | ICD-10-CM | POA: Diagnosis not present

## 2020-03-12 DIAGNOSIS — N401 Enlarged prostate with lower urinary tract symptoms: Secondary | ICD-10-CM | POA: Diagnosis not present

## 2020-03-12 DIAGNOSIS — I252 Old myocardial infarction: Secondary | ICD-10-CM | POA: Diagnosis not present

## 2020-03-12 DIAGNOSIS — I482 Chronic atrial fibrillation, unspecified: Secondary | ICD-10-CM | POA: Diagnosis not present

## 2020-03-12 DIAGNOSIS — I714 Abdominal aortic aneurysm, without rupture: Secondary | ICD-10-CM | POA: Diagnosis not present

## 2020-03-12 DIAGNOSIS — M170 Bilateral primary osteoarthritis of knee: Secondary | ICD-10-CM | POA: Diagnosis not present

## 2020-03-12 DIAGNOSIS — N138 Other obstructive and reflux uropathy: Secondary | ICD-10-CM | POA: Diagnosis not present

## 2020-03-12 DIAGNOSIS — E876 Hypokalemia: Secondary | ICD-10-CM | POA: Diagnosis not present

## 2020-03-12 DIAGNOSIS — J439 Emphysema, unspecified: Secondary | ICD-10-CM | POA: Diagnosis not present

## 2020-03-12 DIAGNOSIS — G4700 Insomnia, unspecified: Secondary | ICD-10-CM | POA: Diagnosis not present

## 2020-03-12 DIAGNOSIS — Z87891 Personal history of nicotine dependence: Secondary | ICD-10-CM | POA: Diagnosis not present

## 2020-03-12 DIAGNOSIS — N182 Chronic kidney disease, stage 2 (mild): Secondary | ICD-10-CM | POA: Diagnosis not present

## 2020-03-12 DIAGNOSIS — H3530 Unspecified macular degeneration: Secondary | ICD-10-CM | POA: Diagnosis not present

## 2020-03-12 DIAGNOSIS — I700 Atherosclerosis of aorta: Secondary | ICD-10-CM | POA: Diagnosis not present

## 2020-03-12 DIAGNOSIS — I2510 Atherosclerotic heart disease of native coronary artery without angina pectoris: Secondary | ICD-10-CM | POA: Diagnosis not present

## 2020-03-12 DIAGNOSIS — I129 Hypertensive chronic kidney disease with stage 1 through stage 4 chronic kidney disease, or unspecified chronic kidney disease: Secondary | ICD-10-CM | POA: Diagnosis not present

## 2020-03-12 DIAGNOSIS — Z9181 History of falling: Secondary | ICD-10-CM | POA: Diagnosis not present

## 2020-03-12 DIAGNOSIS — E860 Dehydration: Secondary | ICD-10-CM | POA: Diagnosis not present

## 2020-03-12 DIAGNOSIS — J189 Pneumonia, unspecified organism: Secondary | ICD-10-CM | POA: Diagnosis not present

## 2020-03-12 DIAGNOSIS — M1711 Unilateral primary osteoarthritis, right knee: Secondary | ICD-10-CM | POA: Diagnosis not present

## 2020-03-12 DIAGNOSIS — Z951 Presence of aortocoronary bypass graft: Secondary | ICD-10-CM | POA: Diagnosis not present

## 2020-03-17 DIAGNOSIS — I2510 Atherosclerotic heart disease of native coronary artery without angina pectoris: Secondary | ICD-10-CM | POA: Diagnosis not present

## 2020-03-17 DIAGNOSIS — I252 Old myocardial infarction: Secondary | ICD-10-CM | POA: Diagnosis not present

## 2020-03-17 DIAGNOSIS — N182 Chronic kidney disease, stage 2 (mild): Secondary | ICD-10-CM | POA: Diagnosis not present

## 2020-03-17 DIAGNOSIS — M1711 Unilateral primary osteoarthritis, right knee: Secondary | ICD-10-CM | POA: Diagnosis not present

## 2020-03-17 DIAGNOSIS — J189 Pneumonia, unspecified organism: Secondary | ICD-10-CM | POA: Diagnosis not present

## 2020-03-17 DIAGNOSIS — I129 Hypertensive chronic kidney disease with stage 1 through stage 4 chronic kidney disease, or unspecified chronic kidney disease: Secondary | ICD-10-CM | POA: Diagnosis not present

## 2020-03-19 DIAGNOSIS — I129 Hypertensive chronic kidney disease with stage 1 through stage 4 chronic kidney disease, or unspecified chronic kidney disease: Secondary | ICD-10-CM | POA: Diagnosis not present

## 2020-03-19 DIAGNOSIS — N182 Chronic kidney disease, stage 2 (mild): Secondary | ICD-10-CM | POA: Diagnosis not present

## 2020-03-19 DIAGNOSIS — I252 Old myocardial infarction: Secondary | ICD-10-CM | POA: Diagnosis not present

## 2020-03-19 DIAGNOSIS — M1711 Unilateral primary osteoarthritis, right knee: Secondary | ICD-10-CM | POA: Diagnosis not present

## 2020-03-19 DIAGNOSIS — I2510 Atherosclerotic heart disease of native coronary artery without angina pectoris: Secondary | ICD-10-CM | POA: Diagnosis not present

## 2020-03-19 DIAGNOSIS — J189 Pneumonia, unspecified organism: Secondary | ICD-10-CM | POA: Diagnosis not present

## 2020-03-23 DIAGNOSIS — I2510 Atherosclerotic heart disease of native coronary artery without angina pectoris: Secondary | ICD-10-CM | POA: Diagnosis not present

## 2020-03-23 DIAGNOSIS — M1711 Unilateral primary osteoarthritis, right knee: Secondary | ICD-10-CM | POA: Diagnosis not present

## 2020-03-23 DIAGNOSIS — J189 Pneumonia, unspecified organism: Secondary | ICD-10-CM | POA: Diagnosis not present

## 2020-03-23 DIAGNOSIS — I129 Hypertensive chronic kidney disease with stage 1 through stage 4 chronic kidney disease, or unspecified chronic kidney disease: Secondary | ICD-10-CM | POA: Diagnosis not present

## 2020-03-23 DIAGNOSIS — I252 Old myocardial infarction: Secondary | ICD-10-CM | POA: Diagnosis not present

## 2020-03-23 DIAGNOSIS — N182 Chronic kidney disease, stage 2 (mild): Secondary | ICD-10-CM | POA: Diagnosis not present

## 2020-03-27 DIAGNOSIS — N182 Chronic kidney disease, stage 2 (mild): Secondary | ICD-10-CM | POA: Diagnosis not present

## 2020-03-27 DIAGNOSIS — J189 Pneumonia, unspecified organism: Secondary | ICD-10-CM | POA: Diagnosis not present

## 2020-03-27 DIAGNOSIS — I129 Hypertensive chronic kidney disease with stage 1 through stage 4 chronic kidney disease, or unspecified chronic kidney disease: Secondary | ICD-10-CM | POA: Diagnosis not present

## 2020-03-27 DIAGNOSIS — M1711 Unilateral primary osteoarthritis, right knee: Secondary | ICD-10-CM | POA: Diagnosis not present

## 2020-03-27 DIAGNOSIS — I2510 Atherosclerotic heart disease of native coronary artery without angina pectoris: Secondary | ICD-10-CM | POA: Diagnosis not present

## 2020-03-27 DIAGNOSIS — I252 Old myocardial infarction: Secondary | ICD-10-CM | POA: Diagnosis not present

## 2020-04-01 DIAGNOSIS — J189 Pneumonia, unspecified organism: Secondary | ICD-10-CM | POA: Diagnosis not present

## 2020-04-01 DIAGNOSIS — I252 Old myocardial infarction: Secondary | ICD-10-CM | POA: Diagnosis not present

## 2020-04-01 DIAGNOSIS — N182 Chronic kidney disease, stage 2 (mild): Secondary | ICD-10-CM | POA: Diagnosis not present

## 2020-04-01 DIAGNOSIS — I129 Hypertensive chronic kidney disease with stage 1 through stage 4 chronic kidney disease, or unspecified chronic kidney disease: Secondary | ICD-10-CM | POA: Diagnosis not present

## 2020-04-01 DIAGNOSIS — M1711 Unilateral primary osteoarthritis, right knee: Secondary | ICD-10-CM | POA: Diagnosis not present

## 2020-04-01 DIAGNOSIS — I2510 Atherosclerotic heart disease of native coronary artery without angina pectoris: Secondary | ICD-10-CM | POA: Diagnosis not present

## 2020-04-03 DIAGNOSIS — M1711 Unilateral primary osteoarthritis, right knee: Secondary | ICD-10-CM | POA: Diagnosis not present

## 2020-04-03 DIAGNOSIS — I129 Hypertensive chronic kidney disease with stage 1 through stage 4 chronic kidney disease, or unspecified chronic kidney disease: Secondary | ICD-10-CM | POA: Diagnosis not present

## 2020-04-03 DIAGNOSIS — I2510 Atherosclerotic heart disease of native coronary artery without angina pectoris: Secondary | ICD-10-CM | POA: Diagnosis not present

## 2020-04-03 DIAGNOSIS — I252 Old myocardial infarction: Secondary | ICD-10-CM | POA: Diagnosis not present

## 2020-04-03 DIAGNOSIS — N182 Chronic kidney disease, stage 2 (mild): Secondary | ICD-10-CM | POA: Diagnosis not present

## 2020-04-03 DIAGNOSIS — J189 Pneumonia, unspecified organism: Secondary | ICD-10-CM | POA: Diagnosis not present

## 2020-04-06 DIAGNOSIS — I252 Old myocardial infarction: Secondary | ICD-10-CM | POA: Diagnosis not present

## 2020-04-06 DIAGNOSIS — N182 Chronic kidney disease, stage 2 (mild): Secondary | ICD-10-CM | POA: Diagnosis not present

## 2020-04-06 DIAGNOSIS — M1711 Unilateral primary osteoarthritis, right knee: Secondary | ICD-10-CM | POA: Diagnosis not present

## 2020-04-06 DIAGNOSIS — I129 Hypertensive chronic kidney disease with stage 1 through stage 4 chronic kidney disease, or unspecified chronic kidney disease: Secondary | ICD-10-CM | POA: Diagnosis not present

## 2020-04-06 DIAGNOSIS — J189 Pneumonia, unspecified organism: Secondary | ICD-10-CM | POA: Diagnosis not present

## 2020-04-06 DIAGNOSIS — I2510 Atherosclerotic heart disease of native coronary artery without angina pectoris: Secondary | ICD-10-CM | POA: Diagnosis not present

## 2020-04-10 DIAGNOSIS — I129 Hypertensive chronic kidney disease with stage 1 through stage 4 chronic kidney disease, or unspecified chronic kidney disease: Secondary | ICD-10-CM | POA: Diagnosis not present

## 2020-04-10 DIAGNOSIS — N182 Chronic kidney disease, stage 2 (mild): Secondary | ICD-10-CM | POA: Diagnosis not present

## 2020-04-10 DIAGNOSIS — I2510 Atherosclerotic heart disease of native coronary artery without angina pectoris: Secondary | ICD-10-CM | POA: Diagnosis not present

## 2020-04-10 DIAGNOSIS — M1711 Unilateral primary osteoarthritis, right knee: Secondary | ICD-10-CM | POA: Diagnosis not present

## 2020-04-10 DIAGNOSIS — I252 Old myocardial infarction: Secondary | ICD-10-CM | POA: Diagnosis not present

## 2020-04-10 DIAGNOSIS — J189 Pneumonia, unspecified organism: Secondary | ICD-10-CM | POA: Diagnosis not present

## 2020-05-01 DIAGNOSIS — I129 Hypertensive chronic kidney disease with stage 1 through stage 4 chronic kidney disease, or unspecified chronic kidney disease: Secondary | ICD-10-CM | POA: Diagnosis not present

## 2020-05-01 DIAGNOSIS — I482 Chronic atrial fibrillation, unspecified: Secondary | ICD-10-CM | POA: Diagnosis not present

## 2020-05-01 DIAGNOSIS — I2510 Atherosclerotic heart disease of native coronary artery without angina pectoris: Secondary | ICD-10-CM | POA: Diagnosis not present

## 2020-05-01 DIAGNOSIS — N138 Other obstructive and reflux uropathy: Secondary | ICD-10-CM | POA: Diagnosis not present

## 2020-05-01 DIAGNOSIS — Z7982 Long term (current) use of aspirin: Secondary | ICD-10-CM | POA: Diagnosis not present

## 2020-05-01 DIAGNOSIS — Z87891 Personal history of nicotine dependence: Secondary | ICD-10-CM | POA: Diagnosis not present

## 2020-05-01 DIAGNOSIS — M1711 Unilateral primary osteoarthritis, right knee: Secondary | ICD-10-CM | POA: Diagnosis not present

## 2020-05-01 DIAGNOSIS — I700 Atherosclerosis of aorta: Secondary | ICD-10-CM | POA: Diagnosis not present

## 2020-05-01 DIAGNOSIS — N401 Enlarged prostate with lower urinary tract symptoms: Secondary | ICD-10-CM | POA: Diagnosis not present

## 2020-05-01 DIAGNOSIS — Z951 Presence of aortocoronary bypass graft: Secondary | ICD-10-CM | POA: Diagnosis not present

## 2020-05-01 DIAGNOSIS — Z9181 History of falling: Secondary | ICD-10-CM | POA: Diagnosis not present

## 2020-05-01 DIAGNOSIS — N182 Chronic kidney disease, stage 2 (mild): Secondary | ICD-10-CM | POA: Diagnosis not present

## 2020-05-01 DIAGNOSIS — J439 Emphysema, unspecified: Secondary | ICD-10-CM | POA: Diagnosis not present

## 2020-05-05 DIAGNOSIS — M1711 Unilateral primary osteoarthritis, right knee: Secondary | ICD-10-CM | POA: Diagnosis not present

## 2020-05-05 DIAGNOSIS — N182 Chronic kidney disease, stage 2 (mild): Secondary | ICD-10-CM | POA: Diagnosis not present

## 2020-05-05 DIAGNOSIS — J439 Emphysema, unspecified: Secondary | ICD-10-CM | POA: Diagnosis not present

## 2020-05-05 DIAGNOSIS — I2510 Atherosclerotic heart disease of native coronary artery without angina pectoris: Secondary | ICD-10-CM | POA: Diagnosis not present

## 2020-05-05 DIAGNOSIS — I482 Chronic atrial fibrillation, unspecified: Secondary | ICD-10-CM | POA: Diagnosis not present

## 2020-05-05 DIAGNOSIS — I129 Hypertensive chronic kidney disease with stage 1 through stage 4 chronic kidney disease, or unspecified chronic kidney disease: Secondary | ICD-10-CM | POA: Diagnosis not present

## 2020-05-07 DIAGNOSIS — I129 Hypertensive chronic kidney disease with stage 1 through stage 4 chronic kidney disease, or unspecified chronic kidney disease: Secondary | ICD-10-CM | POA: Diagnosis not present

## 2020-05-07 DIAGNOSIS — N182 Chronic kidney disease, stage 2 (mild): Secondary | ICD-10-CM | POA: Diagnosis not present

## 2020-05-07 DIAGNOSIS — M1711 Unilateral primary osteoarthritis, right knee: Secondary | ICD-10-CM | POA: Diagnosis not present

## 2020-05-07 DIAGNOSIS — I2510 Atherosclerotic heart disease of native coronary artery without angina pectoris: Secondary | ICD-10-CM | POA: Diagnosis not present

## 2020-05-07 DIAGNOSIS — J439 Emphysema, unspecified: Secondary | ICD-10-CM | POA: Diagnosis not present

## 2020-05-07 DIAGNOSIS — I482 Chronic atrial fibrillation, unspecified: Secondary | ICD-10-CM | POA: Diagnosis not present

## 2020-05-12 DIAGNOSIS — N182 Chronic kidney disease, stage 2 (mild): Secondary | ICD-10-CM | POA: Diagnosis not present

## 2020-05-12 DIAGNOSIS — I482 Chronic atrial fibrillation, unspecified: Secondary | ICD-10-CM | POA: Diagnosis not present

## 2020-05-12 DIAGNOSIS — I2510 Atherosclerotic heart disease of native coronary artery without angina pectoris: Secondary | ICD-10-CM | POA: Diagnosis not present

## 2020-05-12 DIAGNOSIS — J439 Emphysema, unspecified: Secondary | ICD-10-CM | POA: Diagnosis not present

## 2020-05-12 DIAGNOSIS — I129 Hypertensive chronic kidney disease with stage 1 through stage 4 chronic kidney disease, or unspecified chronic kidney disease: Secondary | ICD-10-CM | POA: Diagnosis not present

## 2020-05-12 DIAGNOSIS — M1711 Unilateral primary osteoarthritis, right knee: Secondary | ICD-10-CM | POA: Diagnosis not present

## 2020-05-15 DIAGNOSIS — I129 Hypertensive chronic kidney disease with stage 1 through stage 4 chronic kidney disease, or unspecified chronic kidney disease: Secondary | ICD-10-CM | POA: Diagnosis not present

## 2020-05-15 DIAGNOSIS — N182 Chronic kidney disease, stage 2 (mild): Secondary | ICD-10-CM | POA: Diagnosis not present

## 2020-05-15 DIAGNOSIS — J439 Emphysema, unspecified: Secondary | ICD-10-CM | POA: Diagnosis not present

## 2020-05-15 DIAGNOSIS — I482 Chronic atrial fibrillation, unspecified: Secondary | ICD-10-CM | POA: Diagnosis not present

## 2020-05-15 DIAGNOSIS — M1711 Unilateral primary osteoarthritis, right knee: Secondary | ICD-10-CM | POA: Diagnosis not present

## 2020-05-15 DIAGNOSIS — I2510 Atherosclerotic heart disease of native coronary artery without angina pectoris: Secondary | ICD-10-CM | POA: Diagnosis not present

## 2020-05-18 DIAGNOSIS — I2510 Atherosclerotic heart disease of native coronary artery without angina pectoris: Secondary | ICD-10-CM | POA: Diagnosis not present

## 2020-05-18 DIAGNOSIS — I129 Hypertensive chronic kidney disease with stage 1 through stage 4 chronic kidney disease, or unspecified chronic kidney disease: Secondary | ICD-10-CM | POA: Diagnosis not present

## 2020-05-18 DIAGNOSIS — N182 Chronic kidney disease, stage 2 (mild): Secondary | ICD-10-CM | POA: Diagnosis not present

## 2020-05-18 DIAGNOSIS — M1711 Unilateral primary osteoarthritis, right knee: Secondary | ICD-10-CM | POA: Diagnosis not present

## 2020-05-18 DIAGNOSIS — J439 Emphysema, unspecified: Secondary | ICD-10-CM | POA: Diagnosis not present

## 2020-05-18 DIAGNOSIS — I482 Chronic atrial fibrillation, unspecified: Secondary | ICD-10-CM | POA: Diagnosis not present

## 2020-05-22 DIAGNOSIS — N182 Chronic kidney disease, stage 2 (mild): Secondary | ICD-10-CM | POA: Diagnosis not present

## 2020-05-22 DIAGNOSIS — J439 Emphysema, unspecified: Secondary | ICD-10-CM | POA: Diagnosis not present

## 2020-05-22 DIAGNOSIS — I2510 Atherosclerotic heart disease of native coronary artery without angina pectoris: Secondary | ICD-10-CM | POA: Diagnosis not present

## 2020-05-22 DIAGNOSIS — I129 Hypertensive chronic kidney disease with stage 1 through stage 4 chronic kidney disease, or unspecified chronic kidney disease: Secondary | ICD-10-CM | POA: Diagnosis not present

## 2020-05-22 DIAGNOSIS — I482 Chronic atrial fibrillation, unspecified: Secondary | ICD-10-CM | POA: Diagnosis not present

## 2020-05-22 DIAGNOSIS — M1711 Unilateral primary osteoarthritis, right knee: Secondary | ICD-10-CM | POA: Diagnosis not present

## 2020-05-25 DIAGNOSIS — I482 Chronic atrial fibrillation, unspecified: Secondary | ICD-10-CM | POA: Diagnosis not present

## 2020-05-25 DIAGNOSIS — N182 Chronic kidney disease, stage 2 (mild): Secondary | ICD-10-CM | POA: Diagnosis not present

## 2020-05-25 DIAGNOSIS — M1711 Unilateral primary osteoarthritis, right knee: Secondary | ICD-10-CM | POA: Diagnosis not present

## 2020-05-25 DIAGNOSIS — I129 Hypertensive chronic kidney disease with stage 1 through stage 4 chronic kidney disease, or unspecified chronic kidney disease: Secondary | ICD-10-CM | POA: Diagnosis not present

## 2020-05-25 DIAGNOSIS — I2510 Atherosclerotic heart disease of native coronary artery without angina pectoris: Secondary | ICD-10-CM | POA: Diagnosis not present

## 2020-05-25 DIAGNOSIS — J439 Emphysema, unspecified: Secondary | ICD-10-CM | POA: Diagnosis not present

## 2020-05-28 DIAGNOSIS — I129 Hypertensive chronic kidney disease with stage 1 through stage 4 chronic kidney disease, or unspecified chronic kidney disease: Secondary | ICD-10-CM | POA: Diagnosis not present

## 2020-05-28 DIAGNOSIS — I2510 Atherosclerotic heart disease of native coronary artery without angina pectoris: Secondary | ICD-10-CM | POA: Diagnosis not present

## 2020-05-28 DIAGNOSIS — I482 Chronic atrial fibrillation, unspecified: Secondary | ICD-10-CM | POA: Diagnosis not present

## 2020-05-28 DIAGNOSIS — M1711 Unilateral primary osteoarthritis, right knee: Secondary | ICD-10-CM | POA: Diagnosis not present

## 2020-05-28 DIAGNOSIS — J439 Emphysema, unspecified: Secondary | ICD-10-CM | POA: Diagnosis not present

## 2020-05-28 DIAGNOSIS — N182 Chronic kidney disease, stage 2 (mild): Secondary | ICD-10-CM | POA: Diagnosis not present

## 2020-05-31 DIAGNOSIS — M1711 Unilateral primary osteoarthritis, right knee: Secondary | ICD-10-CM | POA: Diagnosis not present

## 2020-06-02 DIAGNOSIS — M25579 Pain in unspecified ankle and joints of unspecified foot: Secondary | ICD-10-CM | POA: Diagnosis not present

## 2020-06-02 DIAGNOSIS — B351 Tinea unguium: Secondary | ICD-10-CM | POA: Diagnosis not present

## 2020-06-02 DIAGNOSIS — M79671 Pain in right foot: Secondary | ICD-10-CM | POA: Diagnosis not present

## 2020-06-02 DIAGNOSIS — M79675 Pain in left toe(s): Secondary | ICD-10-CM | POA: Diagnosis not present

## 2020-06-02 DIAGNOSIS — M79672 Pain in left foot: Secondary | ICD-10-CM | POA: Diagnosis not present

## 2020-06-02 DIAGNOSIS — I739 Peripheral vascular disease, unspecified: Secondary | ICD-10-CM | POA: Diagnosis not present

## 2020-06-02 DIAGNOSIS — M79674 Pain in right toe(s): Secondary | ICD-10-CM | POA: Diagnosis not present

## 2020-06-02 DIAGNOSIS — L600 Ingrowing nail: Secondary | ICD-10-CM | POA: Diagnosis not present

## 2020-09-25 DEATH — deceased
